# Patient Record
Sex: Female | Born: 1950
Health system: Southern US, Community
[De-identification: ages and names within clinical notes are randomized; demographics above are authoritative.]

## PROBLEM LIST (undated history)

## (undated) DIAGNOSIS — I10 Essential (primary) hypertension: Secondary | ICD-10-CM

## (undated) HISTORY — DX: Essential (primary) hypertension: I10

---

## 2010-12-19 ENCOUNTER — Other Ambulatory Visit (HOSPITAL_COMMUNITY)
Admission: RE | Admit: 2010-12-19 | Discharge: 2010-12-19 | Disposition: A | Discharge status: Home / Self Care | Payer: Managed Care, Other (non HMO) | Source: Ambulatory Visit | Attending: Internal Medicine | Admitting: Internal Medicine

## 2010-12-19 ENCOUNTER — Other Ambulatory Visit: Payer: Self-pay | Admitting: Internal Medicine

## 2010-12-19 ENCOUNTER — Encounter (INDEPENDENT_AMBULATORY_CARE_PROVIDER_SITE_OTHER): Payer: 59 | Admitting: Internal Medicine

## 2010-12-19 DIAGNOSIS — I10 Essential (primary) hypertension: Secondary | ICD-10-CM

## 2010-12-19 DIAGNOSIS — Z01419 Encounter for gynecological examination (general) (routine) without abnormal findings: Secondary | ICD-10-CM | POA: Insufficient documentation

## 2010-12-19 DIAGNOSIS — Z23 Encounter for immunization: Secondary | ICD-10-CM

## 2010-12-19 DIAGNOSIS — Z1231 Encounter for screening mammogram for malignant neoplasm of breast: Secondary | ICD-10-CM

## 2010-12-19 DIAGNOSIS — Z0000 Encounter for general adult medical examination without abnormal findings: Secondary | ICD-10-CM

## 2010-12-19 DIAGNOSIS — N951 Menopausal and female climacteric states: Secondary | ICD-10-CM

## 2011-01-04 ENCOUNTER — Ambulatory Visit
Admission: RE | Admit: 2011-01-04 | Discharge: 2011-01-04 | Disposition: A | Discharge status: Home / Self Care | Payer: 59 | Source: Ambulatory Visit | Attending: Internal Medicine | Admitting: Internal Medicine

## 2011-01-04 DIAGNOSIS — Z1231 Encounter for screening mammogram for malignant neoplasm of breast: Secondary | ICD-10-CM

## 2011-01-04 DIAGNOSIS — N951 Menopausal and female climacteric states: Secondary | ICD-10-CM

## 2011-02-02 ENCOUNTER — Telehealth: Payer: Self-pay | Admitting: Internal Medicine

## 2011-02-02 ENCOUNTER — Encounter: Payer: Self-pay | Admitting: Internal Medicine

## 2011-02-02 NOTE — Telephone Encounter (Signed)
Please mail her a copy

## 2011-02-02 NOTE — Telephone Encounter (Signed)
Spoke with patient regarding results.  Pt encouraged to continue with Vitamin D and Calcium.  Pt denied any questions or concerns at this time.

## 2011-10-11 ENCOUNTER — Other Ambulatory Visit: Payer: Self-pay

## 2011-10-11 MED ORDER — PINDOLOL 5 MG PO TABS: 5.0000 mg | ORAL_TABLET | Freq: Two times a day (BID) | ORAL | Status: DC

## 2011-12-22 ENCOUNTER — Encounter: Payer: Self-pay | Admitting: Internal Medicine

## 2011-12-22 ENCOUNTER — Ambulatory Visit (INDEPENDENT_AMBULATORY_CARE_PROVIDER_SITE_OTHER): Payer: BC Managed Care – PPO | Admitting: Internal Medicine

## 2011-12-22 VITALS — BP 130/82 | HR 76 | Temp 98.1°F | Ht 63.5 in | Wt 168.0 lb

## 2011-12-22 DIAGNOSIS — F419 Anxiety disorder, unspecified: Secondary | ICD-10-CM

## 2011-12-22 DIAGNOSIS — Z0000 Encounter for general adult medical examination without abnormal findings: Secondary | ICD-10-CM

## 2011-12-22 DIAGNOSIS — I10 Essential (primary) hypertension: Secondary | ICD-10-CM

## 2011-12-22 LAB — COMPREHENSIVE METABOLIC PANEL
ALT: 14 U/L (ref 0–35)
AST: 16 U/L (ref 0–37)
CO2: 29 mEq/L (ref 19–32)
Calcium: 10 mg/dL (ref 8.4–10.5)
Chloride: 102 mEq/L (ref 96–112)
Creat: 0.82 mg/dL (ref 0.50–1.10)
Sodium: 139 mEq/L (ref 135–145)
Total Protein: 6.8 g/dL (ref 6.0–8.3)

## 2011-12-22 LAB — CBC WITH DIFFERENTIAL/PLATELET
Basophils Absolute: 0 10*3/uL (ref 0.0–0.1)
Eosinophils Relative: 1 % (ref 0–5)
Lymphocytes Relative: 28 % (ref 12–46)
Lymphs Abs: 2.1 10*3/uL (ref 0.7–4.0)
Neutro Abs: 4.9 10*3/uL (ref 1.7–7.7)
Neutrophils Relative %: 65 % (ref 43–77)
Platelets: 331 10*3/uL (ref 150–400)
RBC: 4.53 MIL/uL (ref 3.87–5.11)
RDW: 12.9 % (ref 11.5–15.5)
WBC: 7.5 10*3/uL (ref 4.0–10.5)

## 2011-12-22 LAB — POCT URINALYSIS DIPSTICK
Glucose, UA: NEGATIVE
Leukocytes, UA: NEGATIVE
Nitrite, UA: NEGATIVE
Protein, UA: NEGATIVE
Spec Grav, UA: 1.01
Urobilinogen, UA: NEGATIVE

## 2011-12-22 LAB — LIPID PANEL
Total CHOL/HDL Ratio: 3.8 Ratio
VLDL: 15 mg/dL (ref 0–40)

## 2011-12-22 LAB — TSH: TSH: 1.601 u[IU]/mL (ref 0.350–4.500)

## 2011-12-23 LAB — VITAMIN D 25 HYDROXY (VIT D DEFICIENCY, FRACTURES): Vit D, 25-Hydroxy: 25 ng/mL — ABNORMAL LOW (ref 30–89)

## 2011-12-29 ENCOUNTER — Other Ambulatory Visit: Payer: Self-pay | Admitting: Internal Medicine

## 2011-12-29 DIAGNOSIS — Z1231 Encounter for screening mammogram for malignant neoplasm of breast: Secondary | ICD-10-CM

## 2012-01-20 ENCOUNTER — Encounter: Payer: Self-pay | Admitting: Internal Medicine

## 2012-01-20 DIAGNOSIS — I10 Essential (primary) hypertension: Secondary | ICD-10-CM | POA: Insufficient documentation

## 2012-01-20 DIAGNOSIS — F419 Anxiety disorder, unspecified: Secondary | ICD-10-CM | POA: Insufficient documentation

## 2012-01-20 NOTE — Progress Notes (Signed)
  Subjective:    Patient ID: Stephanie Hernandez, female    DOB: 07/19/1951, 61 y.o.   MRN: [id number]  HPI 61 year old white female with history of hypertension and anxiety in for physical examination. Her first visit at this office was 12/19/2010. Takes pindolol 5 mg 1-1/2 tablets daily and has taken this for many years for hypertension. Used to take Lexapro for anxiety. Takes Xanax 0.5 mg twice daily as needed.  History of fractured fifth metatarsal 2004. She is allergic to SULFA-it causes hives. Patient had colonoscopy in  [facility] in 2008 which she says was normal . Never had any operations. Had Pneumovax immunization in [facility] in 2002. Tdap given for 12/19/2010. Patient became menopausal around age 3.  Social history: Is single and has never been married. Completed one year of college. 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 Does not smoke or consume alcohol. 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 No children.  Family history: She stayed at home and took care of her 81 in the lid mother. Father died at age 18 suddenly. Mother died at age 45 of respiratory failure. No siblings. Maternal uncle with history of colon cancer in his 5s. Grandmother with history of uterine cancer.   Review of Systems  Constitutional: Negative.   HENT: Negative.   Eyes: Negative.   Respiratory: Negative.   Cardiovascular:       History of hypertension  Gastrointestinal: Negative.   Genitourinary: Negative.   Neurological: Negative.   Psychiatric/Behavioral:       History of anxiety       Objective:   Physical Exam  Vitals reviewed. Constitutional: She is oriented to person, place, and time. She appears well-developed and well-nourished. No distress.  HENT:  Head: Normocephalic and atraumatic.  Right Ear: External ear normal.  Left Ear: External ear normal.  Mouth/Throat: Oropharynx is clear and  moist. No oropharyngeal exudate.  Eyes: Conjunctivae and EOM are normal. Pupils are equal, round, and reactive to light. Right eye exhibits discharge. Left eye exhibits no discharge. No scleral icterus.  Neck: Normal range of motion. Neck supple. No JVD present. No thyromegaly present.  Cardiovascular: Normal rate, regular rhythm, normal heart sounds and intact distal pulses.   No murmur heard. Pulmonary/Chest: Effort normal and breath sounds normal. She has no wheezes.  Abdominal: Soft. Bowel sounds are normal. She exhibits no distension and no mass. There is no tenderness. There is no rebound and no guarding.  Genitourinary:       Bimanual exam is normal  Musculoskeletal: Normal range of motion. She exhibits no edema.  Lymphadenopathy:    She has no cervical adenopathy.  Neurological: She is alert and oriented to person, place, and time. She has normal reflexes. No cranial nerve deficit. Coordination normal.  Skin: Skin is warm and dry. No rash noted. She is not diaphoretic.  Psychiatric: She has a normal mood and affect. Her behavior is normal. Judgment and thought content normal.          Assessment & Plan:  Anxiety  Hypertension  Normal health maintenance exam  Plan: Return in one year or as needed. Have refill Xanax for 6 months. Have refilled pindolol for one year.

## 2012-01-20 NOTE — Patient Instructions (Signed)
Continue same medications and return in one year. 

## 2012-01-22 ENCOUNTER — Ambulatory Visit
Admission: RE | Admit: 2012-01-22 | Discharge: 2012-01-22 | Disposition: A | Discharge status: Home / Self Care | Payer: BC Managed Care – PPO | Source: Ambulatory Visit | Attending: Internal Medicine | Admitting: Internal Medicine

## 2012-01-22 DIAGNOSIS — Z1231 Encounter for screening mammogram for malignant neoplasm of breast: Secondary | ICD-10-CM

## 2012-07-01 ENCOUNTER — Ambulatory Visit (INDEPENDENT_AMBULATORY_CARE_PROVIDER_SITE_OTHER): Payer: BC Managed Care – PPO | Admitting: Internal Medicine

## 2012-07-01 ENCOUNTER — Encounter: Payer: Self-pay | Admitting: Internal Medicine

## 2012-07-01 VITALS — BP 132/80 | HR 76 | Temp 97.6°F | Wt 166.0 lb

## 2012-07-01 DIAGNOSIS — F419 Anxiety disorder, unspecified: Secondary | ICD-10-CM

## 2012-07-01 DIAGNOSIS — E559 Vitamin D deficiency, unspecified: Secondary | ICD-10-CM

## 2012-07-01 DIAGNOSIS — I10 Essential (primary) hypertension: Secondary | ICD-10-CM

## 2012-07-01 DIAGNOSIS — F411 Generalized anxiety disorder: Secondary | ICD-10-CM

## 2012-07-01 NOTE — Progress Notes (Signed)
  Subjective:    Patient ID: Stephanie Hernandez, female    DOB: [birth year], 61 y.o.   MRN: [id number]  HPI For 6 month follow up of HTN and Vitamin D deficiency. Some anxiety issues over job. Needs refill on Xanax. Taking 2000 Units Vitamin D OTC without problems. Level drawn today. BP under good control on current regimen.    Review of Systems     Objective:   Physical Exam Neck supple; Chest clear; cardiac exam regular rate and rhythm normal S1 and S2; extremities without edema; alert and oriented x3, affect appropriate. Skin warm and dry.        Assessment & Plan:  Anxiety  Hypertension  History of vitamin D deficiency  Plan: Return in 6 months for physical exam. Refill Xanax 0.5 mg (#60.) one by mouth twice a day when necessary anxiety with no refill. Continue vitamin D over-the-counter supplementation antihypertensive medication. Has had influenza immunization through employment.

## 2012-07-01 NOTE — Patient Instructions (Addendum)
Take Xanax sparingly for anxiety. Continue antihypertensive medication and vitamin D supplementation. Return in 6 months for physical exam.

## 2012-07-02 LAB — VITAMIN D 25 HYDROXY (VIT D DEFICIENCY, FRACTURES): Vit D, 25-Hydroxy: 64 ng/mL (ref 30–89)

## 2012-08-02 ENCOUNTER — Ambulatory Visit (INDEPENDENT_AMBULATORY_CARE_PROVIDER_SITE_OTHER): Payer: BC Managed Care – PPO | Admitting: Internal Medicine

## 2012-08-02 ENCOUNTER — Encounter: Payer: Self-pay | Admitting: Internal Medicine

## 2012-08-02 VITALS — BP 132/86 | HR 76 | Temp 98.5°F | Wt 169.0 lb

## 2012-08-02 DIAGNOSIS — J329 Chronic sinusitis, unspecified: Secondary | ICD-10-CM

## 2012-08-02 DIAGNOSIS — H6690 Otitis media, unspecified, unspecified ear: Secondary | ICD-10-CM

## 2012-08-02 DIAGNOSIS — J40 Bronchitis, not specified as acute or chronic: Secondary | ICD-10-CM

## 2012-08-02 MED ORDER — CEFTRIAXONE SODIUM 1 G IJ SOLR
1.0000 g | Freq: Once | INTRAMUSCULAR | Status: AC
  Administered 2012-08-02: 1 g via INTRAMUSCULAR

## 2012-08-25 ENCOUNTER — Encounter: Payer: Self-pay | Admitting: Internal Medicine

## 2012-08-25 NOTE — Patient Instructions (Addendum)
Take antibiotics as directed. Take cough medication as directed. Call if not better in 10 days to 2 weeks.

## 2012-08-25 NOTE — Progress Notes (Signed)
  Subjective:    Patient ID: Stephanie Hernandez, female    DOB: 1950-12-05, 62 y.o.   MRN: [id number]  HPI 62 year old white female with protracted URI symptoms. About a month ago she went to urgent care in [place] and was treated with a Zithromax Z-Pak and Tessalon Perles. However she really did not improve after completing that. She called that facility and asked for a refill which was given by phone. She completed that still did not improve. She has a history of hypertension and anxiety. Has continued to have cough and congestion. Trouble sleeping at night because of call. Cough is productive of white sputum. No fever or shaking chills.    Review of Systems     Objective:   Physical Exam skin is pale warm and dry. Nodes none. HEENT exam: TMs are slightly full bilaterally but not red. Pharynx is clear. Neck is supple without significant adenopathy. Chest clear to auscultation without rales or wheezing.        Assessment & Plan:  Bronchitis  Bilateral serous otitis media  Sinusitis  Plan: Levaquin 500 milligrams daily for 7 days.

## 2012-10-15 ENCOUNTER — Other Ambulatory Visit: Payer: Self-pay

## 2012-10-15 ENCOUNTER — Telehealth: Payer: Self-pay | Admitting: Internal Medicine

## 2012-10-15 MED ORDER — ALPRAZOLAM 0.5 MG PO TABS: 0.5000 mg | ORAL_TABLET | Freq: Every evening | ORAL | Status: DC | PRN

## 2012-10-15 NOTE — Telephone Encounter (Signed)
Please refill Xanax to new pharmacy for 6 months.

## 2012-10-16 DIAGNOSIS — F411 Generalized anxiety disorder: Secondary | ICD-10-CM

## 2012-11-05 ENCOUNTER — Ambulatory Visit (INDEPENDENT_AMBULATORY_CARE_PROVIDER_SITE_OTHER): Payer: BC Managed Care – PPO | Admitting: Internal Medicine

## 2012-11-05 ENCOUNTER — Encounter: Payer: Self-pay | Admitting: Internal Medicine

## 2012-11-05 VITALS — BP 140/90 | HR 80 | Temp 98.1°F | Wt 165.0 lb

## 2012-11-05 DIAGNOSIS — F411 Generalized anxiety disorder: Secondary | ICD-10-CM

## 2012-11-05 DIAGNOSIS — I10 Essential (primary) hypertension: Secondary | ICD-10-CM

## 2012-11-05 MED ORDER — ESCITALOPRAM OXALATE 10 MG PO TABS: 10.0000 mg | ORAL_TABLET | Freq: Every day | ORAL | Status: DC

## 2012-11-07 NOTE — Patient Instructions (Addendum)
Continue taking pindolol. Start Lexapro 5 mg daily increasing to 10 mg daily. May take Xanax up to twice daily as needed

## 2012-11-07 NOTE — Progress Notes (Signed)
  Subjective:    Patient ID: Stephanie Hernandez, female    DOB: 05/02/1951, 62 y.o.   MRN: [id number]  HPI  62 year old white female who has history of anxiety and hypertension. His been on pindolol for a number of years started by Dr. Cliffton Asters in [facility].  Recently she has  awakened at night and has a hard time going back to sleep. When she checks her blood pressure is elevated. She resides alone. She is single and never married. Has no pets. Worries a great deal. 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 It is stressful and they're many employees  interacting there on a daily basis. This is frustrating. She is a bit lonely. Just had her 62th birthday. Is warning if she needs a different antihypertensive medication. She is to take Lexapro for anxiety a long time ago. Recently she tried to take it again. She had some leftover that was a couple of years old and she thought it caused her heart to race. I think probably anxiety caused her heart to race.     Review of Systems     Objective:   Physical Exam skin is pale warm and dry. Nodes none. No thyromegaly. Chest clear to auscultation. Cardiac exam regular rate and rhythm normal S1 and S2. Extremities without edema.         Assessment & Plan:   Spent 25 minutes with the patient talking with her about her situation and tried to reassure her.  Impression:  Hypertension  Anxiety  Plan: Have not changed pindolol at the present time. Try Lexapro 5 mg daily working up to 10 mg daily. Prescribed Xanax 0.5 mg (#60) may take one half tablet by mouth every morning and 1 tablet at bedtime. Call if Lexapro not tolerated. Give it 4-6 weeks. Call if blood pressure persistently elevated.

## 2012-11-20 ENCOUNTER — Telehealth: Payer: Self-pay | Admitting: Internal Medicine

## 2012-11-21 NOTE — Telephone Encounter (Signed)
Increase to a whole tablet for one week and call me back. May not be taking ENOUGH medication.

## 2012-11-22 NOTE — Telephone Encounter (Signed)
Spoke with patient on 4/3 to advise to increase dosage per Dr. Lenord Fellers.  Patient verbalized understanding of instructions.

## 2012-12-09 ENCOUNTER — Telehealth: Payer: Self-pay | Admitting: Internal Medicine

## 2012-12-09 MED ORDER — SERTRALINE HCL 50 MG PO TABS: 50.0000 mg | ORAL_TABLET | Freq: Every day | ORAL | Status: DC

## 2012-12-09 NOTE — Telephone Encounter (Signed)
Switch to Zoloft 50 mg daily generic. Appt in 4 weeks.

## 2012-12-30 ENCOUNTER — Other Ambulatory Visit: Payer: Self-pay

## 2012-12-30 ENCOUNTER — Encounter: Payer: Self-pay | Admitting: Internal Medicine

## 2012-12-30 ENCOUNTER — Ambulatory Visit (INDEPENDENT_AMBULATORY_CARE_PROVIDER_SITE_OTHER): Payer: BC Managed Care – PPO | Admitting: Internal Medicine

## 2012-12-30 VITALS — BP 140/90 | HR 70 | Temp 97.0°F | Ht 62.25 in | Wt 159.0 lb

## 2012-12-30 DIAGNOSIS — Z1231 Encounter for screening mammogram for malignant neoplasm of breast: Secondary | ICD-10-CM

## 2012-12-30 DIAGNOSIS — F329 Major depressive disorder, single episode, unspecified: Secondary | ICD-10-CM

## 2012-12-30 DIAGNOSIS — Z0000 Encounter for general adult medical examination without abnormal findings: Secondary | ICD-10-CM

## 2012-12-30 DIAGNOSIS — F411 Generalized anxiety disorder: Secondary | ICD-10-CM

## 2012-12-30 DIAGNOSIS — I10 Essential (primary) hypertension: Secondary | ICD-10-CM

## 2012-12-30 LAB — POCT URINALYSIS DIPSTICK
Glucose, UA: NEGATIVE
Nitrite, UA: NEGATIVE
Urobilinogen, UA: NEGATIVE

## 2012-12-30 LAB — COMPREHENSIVE METABOLIC PANEL
ALT: 11 U/L (ref 0–35)
AST: 13 U/L (ref 0–37)
Alkaline Phosphatase: 64 U/L (ref 39–117)
CO2: 29 mEq/L (ref 19–32)
Chloride: 107 mEq/L (ref 96–112)
Creat: 0.78 mg/dL (ref 0.50–1.10)
Glucose, Bld: 97 mg/dL (ref 70–99)
Sodium: 141 mEq/L (ref 135–145)
Total Bilirubin: 0.5 mg/dL (ref 0.3–1.2)
Total Protein: 5.9 g/dL — ABNORMAL LOW (ref 6.0–8.3)

## 2012-12-30 LAB — CBC WITH DIFFERENTIAL/PLATELET
Eosinophils Absolute: 0.1 10*3/uL (ref 0.0–0.7)
Lymphocytes Relative: 20 % (ref 12–46)
Lymphs Abs: 1.3 10*3/uL (ref 0.7–4.0)
Neutrophils Relative %: 72 % (ref 43–77)
Platelets: 281 10*3/uL (ref 150–400)
RBC: 4.18 MIL/uL (ref 3.87–5.11)
WBC: 6.5 10*3/uL (ref 4.0–10.5)

## 2012-12-30 LAB — LIPID PANEL
Cholesterol: 165 mg/dL (ref 0–200)
LDL Cholesterol: 91 mg/dL (ref 0–99)
VLDL: 25 mg/dL (ref 0–40)

## 2012-12-30 LAB — TSH: TSH: 1.213 u[IU]/mL (ref 0.350–4.500)

## 2012-12-30 NOTE — Progress Notes (Signed)
  Subjective:    Patient ID: Stephanie Hernandez, female    DOB: 05/12/1951, 62 y.o.   MRN: [id number]  HPI 62 year old white female  for CPE and evaluation of medical problems. We recently restarted her Lexapro for anxiety. However, she said she could not tolerate it. She previously took Lexapro a few years ago. We switched her to Zoloft 50 mg daily on April 21. Zoloft  has been causing some diarrhea for past 3 weeks. BP elevated on Pindolol. Continues to have element of anxiety which seems to cause labile hypertension. Still has stress at work. Takes Xanax sparingly for anxiety.  Past medical history: History of fractured fifth metatarsal 2004. Colonoscopy in 2008 and [place] which she says was normal. No history of operations. Had Pneumovax immunization at [facility] in 2002. Became menopausal around age 51. Tetanus immunization done April 2012.  Social history: Single, never married. Completed one year of college. 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 Does not smoke or consume alcohol. 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 No children.  Family history: She stated home and took care of her elderly mother. Father died at age 52 suddenly. Mother died at age 7 of respiratory failure. No siblings. Maternal uncle with history of colon cancer in his 64s. Grandmother with history of uterine cancer.    Review of Systems  Constitutional: Positive for fatigue.  HENT: Negative.   Eyes: Negative.   Cardiovascular:       History of labile blood pressure. Occasional palpitations.  Gastrointestinal: Negative.   Neurological: Negative.   Hematological: Negative.   Psychiatric/Behavioral:       Anxiety. Mild depression.       Objective:   Physical Exam  Vitals reviewed. Constitutional: She is oriented to person, place, and time. She appears well-developed and well-nourished. No distress.  HENT:  Head:  Normocephalic and atraumatic.  Right Ear: External ear normal.  Left Ear: External ear normal.  Mouth/Throat: Oropharynx is clear and moist.  Eyes: EOM are normal. Pupils are equal, round, and reactive to light. Right eye exhibits no discharge. Left eye exhibits no discharge.  Neck: Neck supple. No JVD present. No thyromegaly present.  Cardiovascular: Normal rate, regular rhythm, normal heart sounds and intact distal pulses.   No murmur heard. Pulmonary/Chest: Effort normal and breath sounds normal. No respiratory distress. She has no wheezes. She has no rales. She exhibits no tenderness.  Breasts normal female  Abdominal: Soft. Bowel sounds are normal. She exhibits no distension and no mass. There is no tenderness. There is no rebound and no guarding.  Genitourinary:  Bimanual exam normal  Musculoskeletal: Normal range of motion. She exhibits no edema.  Lymphadenopathy:    She has no cervical adenopathy.  Neurological: She is alert and oriented to person, place, and time. She has normal reflexes. No cranial nerve deficit. Coordination normal.  Skin: Skin is warm and dry. No rash noted. She is not diaphoretic. No erythema. No pallor.  Psychiatric: She has a normal mood and affect. Her behavior is normal. Judgment and thought content normal.          Assessment & Plan:  Labile hypertension. Previously treated with pindolol. Blood pressure is elevated. Try Cozaar and discontinue pindolol  Continue Zoloft 50 mg daily for anxiety depression

## 2012-12-31 LAB — VITAMIN D 25 HYDROXY (VIT D DEFICIENCY, FRACTURES): Vit D, 25-Hydroxy: 38 ng/mL (ref 30–89)

## 2013-01-09 ENCOUNTER — Telehealth: Payer: Self-pay

## 2013-01-09 NOTE — Telephone Encounter (Signed)
Patient states that since switching from Pindolol to Cozaar, she is noticing her heart pounding more, and was worried because her pulse read 112 on auto BP cuff. BP was 111/71. Manual pulse at the time of this call was 80. Per Dr. Lenord Fellers, this sensation is because she stopped the Pindolol. May resume it, along with the Cozaar. She already has an appointment to return on 02/03/2013.patient verbalizes understanding.

## 2013-01-19 NOTE — Patient Instructions (Addendum)
Start Cozaar for hypertension. Stop pindolol. Continue Zoloft 50 mg daily. Return in 4 weeks.

## 2013-02-03 ENCOUNTER — Encounter: Payer: Self-pay | Admitting: Internal Medicine

## 2013-02-03 ENCOUNTER — Ambulatory Visit
Admission: RE | Admit: 2013-02-03 | Discharge: 2013-02-03 | Disposition: A | Discharge status: Home / Self Care | Payer: BC Managed Care – PPO | Source: Ambulatory Visit

## 2013-02-03 ENCOUNTER — Ambulatory Visit (INDEPENDENT_AMBULATORY_CARE_PROVIDER_SITE_OTHER): Payer: BC Managed Care – PPO | Admitting: Internal Medicine

## 2013-02-03 VITALS — BP 126/82 | HR 72 | Temp 98.1°F | Wt 160.0 lb

## 2013-02-03 DIAGNOSIS — Z1231 Encounter for screening mammogram for malignant neoplasm of breast: Secondary | ICD-10-CM

## 2013-02-03 DIAGNOSIS — F329 Major depressive disorder, single episode, unspecified: Secondary | ICD-10-CM

## 2013-02-03 DIAGNOSIS — R002 Palpitations: Secondary | ICD-10-CM

## 2013-02-03 DIAGNOSIS — I10 Essential (primary) hypertension: Secondary | ICD-10-CM

## 2013-02-03 DIAGNOSIS — Z79899 Other long term (current) drug therapy: Secondary | ICD-10-CM

## 2013-02-03 LAB — BASIC METABOLIC PANEL
Chloride: 102 mEq/L (ref 96–112)
Creat: 0.77 mg/dL (ref 0.50–1.10)
Potassium: 4.3 mEq/L (ref 3.5–5.3)

## 2013-02-03 MED ORDER — LOSARTAN POTASSIUM 50 MG PO TABS: 50.0000 mg | ORAL_TABLET | Freq: Every day | ORAL | Status: DC

## 2013-02-03 MED ORDER — PINDOLOL 5 MG PO TABS: 5.0000 mg | ORAL_TABLET | ORAL | Status: DC

## 2013-02-03 NOTE — Patient Instructions (Addendum)
Continue same meds and return January 2015 for 6 month recheck.

## 2013-02-04 MED ORDER — LOSARTAN POTASSIUM 25 MG PO TABS: 25.0000 mg | ORAL_TABLET | Freq: Every day | ORAL | Status: DC

## 2013-02-16 DIAGNOSIS — F329 Major depressive disorder, single episode, unspecified: Secondary | ICD-10-CM | POA: Insufficient documentation

## 2013-02-16 DIAGNOSIS — R002 Palpitations: Secondary | ICD-10-CM | POA: Insufficient documentation

## 2013-02-16 NOTE — Progress Notes (Signed)
  Subjective:    Patient ID: Manon Banbury, female    DOB: [birth year], 62 y.o.   MRN: [id number]  HPI 62 y.o. white female with history of hypertension, anxiety, palpitations, depression in today for followup. She called recently saying she was experiencing palpitations on Cozaar. She is now taking pindolol and Cozaar. Palpitations have improved. Also has switched from Lexapro to Zoloft for depression. Is tolerating that well but has some diarrhea with Zoloft which hopefully will resolve. Overall she's feeling better with regard anxiety depression.      Review of Systems     Objective:   Physical Exam skin warm and dry chest clear to auscultation. Cardiac exam regular rate and rhythm normal S1 and S2. Extremities without edema.        Assessment & Plan:  Hypertension-improved with Cozaar.  Palpitations-improved after restarting pindolol in addition to Cozaar  Anxiety depression-improved with Zoloft  Plan: Return in 6 months

## 2013-03-07 ENCOUNTER — Encounter: Payer: Self-pay | Admitting: Internal Medicine

## 2013-03-07 ENCOUNTER — Ambulatory Visit (INDEPENDENT_AMBULATORY_CARE_PROVIDER_SITE_OTHER): Payer: BC Managed Care – PPO | Admitting: Internal Medicine

## 2013-03-07 VITALS — BP 136/84 | HR 80 | Temp 98.3°F | Wt 165.0 lb

## 2013-03-07 DIAGNOSIS — L299 Pruritus, unspecified: Secondary | ICD-10-CM

## 2013-03-07 DIAGNOSIS — B373 Candidiasis of vulva and vagina: Secondary | ICD-10-CM

## 2013-03-07 DIAGNOSIS — B3731 Acute candidiasis of vulva and vagina: Secondary | ICD-10-CM

## 2013-03-07 DIAGNOSIS — IMO0001 Reserved for inherently not codable concepts without codable children: Secondary | ICD-10-CM

## 2013-03-07 DIAGNOSIS — R350 Frequency of micturition: Secondary | ICD-10-CM

## 2013-03-07 MED ORDER — FLUCONAZOLE 150 MG PO TABS: 150.0000 mg | ORAL_TABLET | Freq: Once | ORAL | Status: DC

## 2013-03-07 NOTE — Patient Instructions (Addendum)
Take Diflucan once and repeat in 3 days if still symptomatic

## 2013-05-25 NOTE — Progress Notes (Signed)
  Subjective:    Patient ID: Stephanie Hernandez, female    DOB: 1951/05/04, 62 y.o.   MRN: [id number]  HPI Patient in today complaining of vaginal itching. She is on pindolol and Cozaar for hypertension. She is on Zoloft for anxiety depression. Still having issues with fatigue. Palpitations improved with restarting pindolol. Patient thought she had a urinary tract infection and and took some over-the-counter medication for bladder spasms.   Review of Systems     Objective:   Physical Exam Vaginal exam shows white vaginal discharge. Wet prep consistent with yeast       Assessment & Plan:  Candida vaginitis  Hypertension  Anxiety depression  Plan: Diflucan 150 mg tablet x1. Repeat if necessary.

## 2013-06-26 ENCOUNTER — Other Ambulatory Visit: Payer: Self-pay

## 2013-08-08 ENCOUNTER — Ambulatory Visit: Payer: BC Managed Care – PPO | Admitting: Internal Medicine

## 2013-09-12 ENCOUNTER — Ambulatory Visit: Payer: BC Managed Care – PPO | Admitting: Internal Medicine

## 2013-10-06 ENCOUNTER — Encounter: Payer: Self-pay | Admitting: Internal Medicine

## 2013-10-06 ENCOUNTER — Ambulatory Visit (INDEPENDENT_AMBULATORY_CARE_PROVIDER_SITE_OTHER): Payer: BC Managed Care – PPO | Admitting: Internal Medicine

## 2013-10-06 VITALS — BP 122/88 | HR 68 | Temp 98.2°F | Wt 178.5 lb

## 2013-10-06 DIAGNOSIS — I10 Essential (primary) hypertension: Secondary | ICD-10-CM

## 2013-10-06 DIAGNOSIS — F411 Generalized anxiety disorder: Secondary | ICD-10-CM

## 2013-10-06 LAB — BASIC METABOLIC PANEL
BUN: 13 mg/dL (ref 6–23)
CHLORIDE: 104 meq/L (ref 96–112)
CO2: 27 meq/L (ref 19–32)
Calcium: 9.6 mg/dL (ref 8.4–10.5)
Creat: 0.81 mg/dL (ref 0.50–1.10)
Glucose, Bld: 87 mg/dL (ref 70–99)
POTASSIUM: 4.3 meq/L (ref 3.5–5.3)
Sodium: 139 mEq/L (ref 135–145)

## 2013-10-06 MED ORDER — ALPRAZOLAM 0.5 MG PO TABS
0.5000 mg | ORAL_TABLET | Freq: Two times a day (BID) | ORAL | Status: DC | PRN
Start: 2013-10-06 — End: 2014-04-03

## 2013-10-06 NOTE — Progress Notes (Signed)
   Subjective:    Patient ID: Posey BoyerJudy Wannamaker, female    DOB: [birth year], 63 y.o.   MRN: [id number]  HPI  63 year old Female for 6 month recheck. Doing well with anxiety depression although work remained stressful. Blood pressure is excellent on current regimen. No new complaints. Basic metabolic panel drawn today. Stopped taking Zoloft because she thought was calling her to gain weight.    Review of Systems     Objective:   Physical Exam neck is supple without JVD thyromegaly or carotid bruits. Chest clear. Cardiac exam regular rate and rhythm. Extremities without edema.        Assessment & Plan:  Anxiety depression-treated with Xanax only now. Patient discontinued Zoloft because she thought it was causing weight gain  Hypertension-stable  Plan: Basic metabolic panel drawn and is within normal limits. Return in 6 months for physical exam and continue same medication.

## 2013-10-19 ENCOUNTER — Encounter: Payer: Self-pay | Admitting: Internal Medicine

## 2013-10-19 NOTE — Patient Instructions (Signed)
Zoloft has been discontinued by patient. Continue same medication and return in 6 months.

## 2014-02-04 ENCOUNTER — Other Ambulatory Visit: Payer: Self-pay | Admitting: Internal Medicine

## 2014-02-09 ENCOUNTER — Encounter: Payer: Self-pay | Admitting: Internal Medicine

## 2014-02-09 ENCOUNTER — Ambulatory Visit (INDEPENDENT_AMBULATORY_CARE_PROVIDER_SITE_OTHER): Payer: BC Managed Care – PPO | Admitting: Internal Medicine

## 2014-02-09 VITALS — BP 116/76 | HR 80 | Temp 98.8°F | Wt 172.0 lb

## 2014-02-09 DIAGNOSIS — F3289 Other specified depressive episodes: Secondary | ICD-10-CM

## 2014-02-09 DIAGNOSIS — F329 Major depressive disorder, single episode, unspecified: Secondary | ICD-10-CM

## 2014-02-09 DIAGNOSIS — J069 Acute upper respiratory infection, unspecified: Secondary | ICD-10-CM

## 2014-02-09 DIAGNOSIS — F32A Depression, unspecified: Secondary | ICD-10-CM

## 2014-02-09 DIAGNOSIS — I10 Essential (primary) hypertension: Secondary | ICD-10-CM

## 2014-02-09 MED ORDER — SERTRALINE HCL 50 MG PO TABS: 50.0000 mg | ORAL_TABLET | Freq: Every day | ORAL | Status: DC

## 2014-02-09 MED ORDER — LOSARTAN POTASSIUM 25 MG PO TABS: 25.0000 mg | ORAL_TABLET | Freq: Every day | ORAL | Status: DC

## 2014-02-09 MED ORDER — LEVOFLOXACIN 500 MG PO TABS: 500.0000 mg | ORAL_TABLET | Freq: Every day | ORAL | Status: DC

## 2014-02-09 NOTE — Progress Notes (Signed)
   Subjective:    Patient ID: Stephanie Hernandez, female    DOB: 12/19/1950, 63 y.o.   MRN: [id number]  HPI  Onset slight cough  June 19th and subsequently developed hoarseness and upper respiratory congestion. Had fever. Hasn't felt well since then. Did not go to work today. History of hypertension and palpitations. Feels that she may be depressed and would like to go back on Zoloft.    Review of Systems     Objective:   Physical Exam  TMs and pharynx are clear. Neck is supple. She sounds a bit hoarse. Chest clear to auscultation without rales or wheezing.        Assessment & Plan:  Acute URI  Hypertension-refill losartan  Depression-refill Zoloft  History of anxiety  Plan: Levaquin 500 milligrams daily for 7 days. Refill Zoloft so she may restart that. Refill losartan.

## 2014-02-09 NOTE — Patient Instructions (Addendum)
Levaquin 500 mg daily x 7 days. Refill Losartan. Restart Zoloft.

## 2014-02-14 ENCOUNTER — Encounter: Payer: Self-pay | Admitting: Internal Medicine

## 2014-04-03 ENCOUNTER — Ambulatory Visit (INDEPENDENT_AMBULATORY_CARE_PROVIDER_SITE_OTHER): Payer: BC Managed Care – PPO | Admitting: Internal Medicine

## 2014-04-03 ENCOUNTER — Other Ambulatory Visit (HOSPITAL_COMMUNITY)
Admission: RE | Admit: 2014-04-03 | Discharge: 2014-04-03 | Disposition: A | Discharge status: Home / Self Care | Payer: BC Managed Care – PPO | Source: Ambulatory Visit | Attending: Internal Medicine | Admitting: Internal Medicine

## 2014-04-03 ENCOUNTER — Encounter: Payer: Self-pay | Admitting: Internal Medicine

## 2014-04-03 VITALS — BP 126/82 | HR 76 | Ht 62.0 in | Wt 164.0 lb

## 2014-04-03 DIAGNOSIS — G4700 Insomnia, unspecified: Secondary | ICD-10-CM

## 2014-04-03 DIAGNOSIS — Z1329 Encounter for screening for other suspected endocrine disorder: Secondary | ICD-10-CM

## 2014-04-03 DIAGNOSIS — F341 Dysthymic disorder: Secondary | ICD-10-CM

## 2014-04-03 DIAGNOSIS — F419 Anxiety disorder, unspecified: Secondary | ICD-10-CM

## 2014-04-03 DIAGNOSIS — R002 Palpitations: Secondary | ICD-10-CM

## 2014-04-03 DIAGNOSIS — Z13228 Encounter for screening for other metabolic disorders: Secondary | ICD-10-CM

## 2014-04-03 DIAGNOSIS — Z01419 Encounter for gynecological examination (general) (routine) without abnormal findings: Secondary | ICD-10-CM | POA: Diagnosis not present

## 2014-04-03 DIAGNOSIS — F32A Depression, unspecified: Secondary | ICD-10-CM

## 2014-04-03 DIAGNOSIS — Z13220 Encounter for screening for lipoid disorders: Secondary | ICD-10-CM

## 2014-04-03 DIAGNOSIS — I10 Essential (primary) hypertension: Secondary | ICD-10-CM

## 2014-04-03 DIAGNOSIS — F329 Major depressive disorder, single episode, unspecified: Secondary | ICD-10-CM

## 2014-04-03 DIAGNOSIS — Z130 Encounter for screening for diseases of the blood and blood-forming organs and certain disorders involving the immune mechanism: Secondary | ICD-10-CM

## 2014-04-03 DIAGNOSIS — Z0000 Encounter for general adult medical examination without abnormal findings: Secondary | ICD-10-CM

## 2014-04-03 LAB — COMPREHENSIVE METABOLIC PANEL
ALT: 18 U/L (ref 0–35)
AST: 16 U/L (ref 0–37)
Albumin: 4.6 g/dL (ref 3.5–5.2)
Alkaline Phosphatase: 72 U/L (ref 39–117)
BUN: 9 mg/dL (ref 6–23)
CALCIUM: 9.9 mg/dL (ref 8.4–10.5)
CHLORIDE: 100 meq/L (ref 96–112)
CO2: 26 meq/L (ref 19–32)
CREATININE: 0.74 mg/dL (ref 0.50–1.10)
GLUCOSE: 98 mg/dL (ref 70–99)
Potassium: 4.6 mEq/L (ref 3.5–5.3)
Sodium: 140 mEq/L (ref 135–145)
Total Bilirubin: 0.8 mg/dL (ref 0.2–1.2)
Total Protein: 6.8 g/dL (ref 6.0–8.3)

## 2014-04-03 LAB — POCT URINALYSIS DIPSTICK
Bilirubin, UA: NEGATIVE
GLUCOSE UA: NEGATIVE
KETONES UA: NEGATIVE
Leukocytes, UA: NEGATIVE
Nitrite, UA: NEGATIVE
PROTEIN UA: NEGATIVE
RBC UA: NEGATIVE
Urobilinogen, UA: NEGATIVE
pH, UA: 7

## 2014-04-03 LAB — LIPID PANEL
Cholesterol: 172 mg/dL (ref 0–200)
HDL: 49 mg/dL (ref >39)
LDL CALC: 94 mg/dL (ref 0–99)
TRIGLYCERIDES: 146 mg/dL (ref <150)
Total CHOL/HDL Ratio: 3.5 Ratio
VLDL: 29 mg/dL (ref 0–40)

## 2014-04-03 LAB — CBC WITH DIFFERENTIAL/PLATELET
Basophils Absolute: 0 10*3/uL (ref 0.0–0.1)
Basophils Relative: 0 % (ref 0–1)
EOS PCT: 1 % (ref 0–5)
Eosinophils Absolute: 0.1 10*3/uL (ref 0.0–0.7)
HEMATOCRIT: 40.3 % (ref 36.0–46.0)
Hemoglobin: 13.5 g/dL (ref 12.0–15.0)
LYMPHS ABS: 1.6 10*3/uL (ref 0.7–4.0)
LYMPHS PCT: 19 % (ref 12–46)
MCH: 30.5 pg (ref 26.0–34.0)
MCHC: 33.5 g/dL (ref 30.0–36.0)
MCV: 91 fL (ref 78.0–100.0)
MONO ABS: 0.6 10*3/uL (ref 0.1–1.0)
Monocytes Relative: 7 % (ref 3–12)
Neutro Abs: 6.1 10*3/uL (ref 1.7–7.7)
Neutrophils Relative %: 73 % (ref 43–77)
Platelets: 359 10*3/uL (ref 150–400)
RBC: 4.43 MIL/uL (ref 3.87–5.11)
RDW: 13.7 % (ref 11.5–15.5)
WBC: 8.4 10*3/uL (ref 4.0–10.5)

## 2014-04-03 MED ORDER — SERTRALINE HCL 100 MG PO TABS: 100.0000 mg | ORAL_TABLET | Freq: Every day | ORAL | Status: DC

## 2014-04-03 MED ORDER — ALPRAZOLAM 1 MG PO TABS: ORAL_TABLET | ORAL | Status: DC

## 2014-04-03 NOTE — Patient Instructions (Addendum)
Call with progress report in 4 weeks. Increase Xanax  1 mg at bedtime. Increase Zoloft to 100 mg daily. Takes 0.5 mg of Xanax every morning. Consider counseling.

## 2014-04-04 LAB — TSH: TSH: 1.629 u[IU]/mL (ref 0.350–4.500)

## 2014-04-04 LAB — VITAMIN D 25 HYDROXY (VIT D DEFICIENCY, FRACTURES): VIT D 25 HYDROXY: 41 ng/mL (ref 30–89)

## 2014-04-07 LAB — CYTOLOGY - PAP

## 2014-04-28 ENCOUNTER — Telehealth: Payer: Self-pay | Admitting: Internal Medicine

## 2014-04-28 NOTE — Telephone Encounter (Signed)
Not tolerating Zoloft. Wants something other than Xanax to sleep. Try Pristiq 50 mg daily and Ativan one mg hs. Please call her and tell her I tried home and cell phone. If these do not work, then I will probably get her a med consult.

## 2014-04-28 NOTE — Telephone Encounter (Signed)
RE:  Zoloft.  It's making her feel confused; not like herself.  She's been on it a month.  She stopped it x 2 weeks.  No dizziness.  The confusion is better.  She needs something for anxiety.  What other options does she have?    She thinks she wants something other than Xanax to take at night for sleep.  Patient states you told her to call and you would call her after hours; so I didn't make her an appointment until after sending this for your review.  Please advise.

## 2014-05-01 MED ORDER — LORAZEPAM 1 MG PO TABS: 1.0000 mg | ORAL_TABLET | Freq: Every day | ORAL | Status: DC

## 2014-05-01 MED ORDER — DESVENLAFAXINE SUCCINATE ER 50 MG PO TB24: 50.0000 mg | ORAL_TABLET | Freq: Every day | ORAL | Status: DC

## 2014-05-01 NOTE — Telephone Encounter (Signed)
Left message for patient to call office regarding medication change.  Pristiq and ativan sent to pharmacy.

## 2014-05-01 NOTE — Addendum Note (Signed)
Addended by: Judd Gaudier on: 05/01/2014 03:10 PM   Modules accepted: Orders

## 2014-05-04 ENCOUNTER — Ambulatory Visit (INDEPENDENT_AMBULATORY_CARE_PROVIDER_SITE_OTHER): Payer: BC Managed Care – PPO | Admitting: Internal Medicine

## 2014-05-04 ENCOUNTER — Encounter: Payer: Self-pay | Admitting: Internal Medicine

## 2014-05-04 VITALS — BP 148/102 | HR 80 | Ht 62.0 in | Wt 163.0 lb

## 2014-05-04 DIAGNOSIS — F411 Generalized anxiety disorder: Secondary | ICD-10-CM

## 2014-05-04 DIAGNOSIS — F3289 Other specified depressive episodes: Secondary | ICD-10-CM

## 2014-05-04 DIAGNOSIS — F32A Depression, unspecified: Secondary | ICD-10-CM

## 2014-05-04 DIAGNOSIS — F329 Major depressive disorder, single episode, unspecified: Secondary | ICD-10-CM

## 2014-05-04 NOTE — Progress Notes (Signed)
   Subjective:    Patient ID: Stephanie Hernandez, female    DOB: 1950-10-11, 63 y.o.   MRN: [id number]  HPI  Accompanied today by a friend and nurse practitioner, Lanier Prude. Patient feels that Zoloft and Xanax or not working for her. However one night she took exam next 1 mg and slept all night long and did not wake which frightened her. She has a history of anxiety and depression related to situational stress at work. She is overwhelmed by her job with multiple duties. Feels like she cannot find another job at this point in time at her age. Has been encouraged look elsewhere by her friend and myself.    Review of Systems     Objective:   Physical Exam  Spent 25 minutes speaking with patient about these issues. She agrees to try new SSRI. Have provided samples of Viibryd. Suggested she take Xanax 0.5 mg in the morning, 0.5 mg in the early afternoon, and 1 mg at bedtime      Assessment & Plan:  Anxiety  Depression  Plan: Start low-dose fibroid and take Xanax as above. Let he know through Patient Advice portal how you're doing in 4-6 weeks

## 2014-05-17 ENCOUNTER — Encounter: Payer: Self-pay | Admitting: Internal Medicine

## 2014-05-17 NOTE — Patient Instructions (Signed)
Start low-dose Viibryd and take Xanax as directed. Let me know through patient advice portal how you're doing in 4-6 weeks

## 2014-05-17 NOTE — Progress Notes (Signed)
   Subjective:    Patient ID: Stephanie Hernandez, female    DOB: 1951/03/04, 63 y.o.   MRN: [id number]  HPI 63 year old White Female in today for health maintenance and evaluation of medical issues. Considerable issues with anxiety and insomnia. Not happy with job. Workload is excessive. He is depressed as well as anxious. Doesn't feel like she can find another position at this point in time at her age.  She has been on Lexapro in the past. Has been on Zoloft 50 mg recently. Takes Xanax for anxiety. History of hypertension.  Past medical history: History of fractured fifth metatarsal 2004. Colonoscopy in 2008 done in [facility] which she says was normal. No history of operations. Patient had Pneumovax immunization at [facility] in 2002. Became menopausal around age 71. Tetanus immunization done April 2012.  Social history: Single, never married. Completed one year of college. 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 No children.  Family history: She stayed at home and took care of her elderly mother. Father died at age 65 suddenly. Mother died at age 22 of respiratory failure. No siblings. Maternal uncle with history of colon cancer in his 47s. Grandmother with history of uterine cancer.    Review of Systems  Constitutional: Positive for fatigue.  Eyes: Negative.   Respiratory: Negative.   Cardiovascular: Negative for chest pain.  Gastrointestinal: Negative.   Genitourinary: Negative.   Neurological: Negative.   Psychiatric/Behavioral:       Anxiety depression and insomnia       Objective:   Physical Exam  Vitals reviewed. Constitutional: She is oriented to person, place, and time. She appears well-developed and well-nourished. No distress.  HENT:  Head: Normocephalic and atraumatic.  Right Ear: External ear normal.  Left Ear:  External ear normal.  Mouth/Throat: Oropharynx is clear and moist. No oropharyngeal exudate.  Eyes: Conjunctivae and EOM are normal. Pupils are equal, round, and reactive to light. Right eye exhibits no discharge. Left eye exhibits no discharge.  Neck: Neck supple. No JVD present. No thyromegaly present.  Cardiovascular: Normal rate, regular rhythm, normal heart sounds and intact distal pulses.   No murmur heard. Pulmonary/Chest: Effort normal and breath sounds normal. No respiratory distress. She has no rales. She exhibits no tenderness.  Breasts normal female  Abdominal: Soft. Bowel sounds are normal. She exhibits no distension and no mass. There is no tenderness. There is no rebound.  Genitourinary:  Bimanual exam normal. Pap taken.  Musculoskeletal: Normal range of motion. She exhibits no edema.  Lymphadenopathy:    She has no cervical adenopathy.  Neurological: She is alert and oriented to person, place, and time. She has normal reflexes. No cranial nerve deficit.  Skin: Skin is warm and dry. No rash noted. She is not diaphoretic.  Psychiatric: Her behavior is normal. Judgment and thought content normal.  Mood is anxious and dysphoric          Assessment & Plan:  Hypertension-stable on current regimen  History of palpitations treated with Pindolol  Anxiety and depression-see below  Insomnia-see below  Situational stress-may need to find new employment  Plan: Increase Zoloft 100 mg daily. Takes Xanax 0.5 mg every morning and 1 mg at bedtime.   Consider counseling for situational stress. Call with progress report in 4 weeks.

## 2014-06-02 ENCOUNTER — Other Ambulatory Visit: Payer: Self-pay

## 2014-06-02 DIAGNOSIS — Z1239 Encounter for other screening for malignant neoplasm of breast: Secondary | ICD-10-CM

## 2014-07-01 ENCOUNTER — Ambulatory Visit
Admission: RE | Admit: 2014-07-01 | Discharge: 2014-07-01 | Disposition: A | Discharge status: Home / Self Care | Payer: BC Managed Care – PPO | Source: Ambulatory Visit

## 2014-07-01 DIAGNOSIS — Z1239 Encounter for other screening for malignant neoplasm of breast: Secondary | ICD-10-CM

## 2014-07-03 ENCOUNTER — Other Ambulatory Visit: Payer: Self-pay | Admitting: Internal Medicine

## 2014-07-03 DIAGNOSIS — R928 Other abnormal and inconclusive findings on diagnostic imaging of breast: Secondary | ICD-10-CM

## 2014-07-10 ENCOUNTER — Ambulatory Visit
Admission: RE | Admit: 2014-07-10 | Discharge: 2014-07-10 | Disposition: A | Discharge status: Home / Self Care | Payer: BC Managed Care – PPO | Source: Ambulatory Visit | Attending: Internal Medicine | Admitting: Internal Medicine

## 2014-07-10 ENCOUNTER — Other Ambulatory Visit: Payer: Self-pay | Admitting: Internal Medicine

## 2014-07-10 DIAGNOSIS — R928 Other abnormal and inconclusive findings on diagnostic imaging of breast: Secondary | ICD-10-CM

## 2014-07-10 DIAGNOSIS — N6320 Unspecified lump in the left breast, unspecified quadrant: Secondary | ICD-10-CM

## 2014-07-22 ENCOUNTER — Telehealth: Payer: Self-pay | Admitting: Internal Medicine

## 2014-07-22 ENCOUNTER — Ambulatory Visit
Admission: RE | Admit: 2014-07-22 | Discharge: 2014-07-22 | Disposition: A | Discharge status: Home / Self Care | Payer: BC Managed Care – PPO | Source: Ambulatory Visit | Attending: Internal Medicine | Admitting: Internal Medicine

## 2014-07-22 DIAGNOSIS — N6320 Unspecified lump in the left breast, unspecified quadrant: Secondary | ICD-10-CM

## 2014-07-22 NOTE — Telephone Encounter (Signed)
Patient had breast biopsy today. Results are pending. Called  to tell her I was aware of breast biopsy and offer  support. Says she feels good post procedure.

## 2014-08-03 ENCOUNTER — Other Ambulatory Visit: Payer: Self-pay

## 2014-08-03 MED ORDER — PINDOLOL 5 MG PO TABS: 5.0000 mg | ORAL_TABLET | Freq: Every day | ORAL | Status: DC

## 2014-12-21 ENCOUNTER — Ambulatory Visit (INDEPENDENT_AMBULATORY_CARE_PROVIDER_SITE_OTHER): Payer: BLUE CROSS/BLUE SHIELD | Admitting: Internal Medicine

## 2014-12-21 ENCOUNTER — Encounter: Payer: Self-pay | Admitting: Internal Medicine

## 2014-12-21 VITALS — BP 118/76 | HR 71 | Temp 97.6°F | Wt 182.0 lb

## 2014-12-21 DIAGNOSIS — I10 Essential (primary) hypertension: Secondary | ICD-10-CM | POA: Diagnosis not present

## 2014-12-21 DIAGNOSIS — R609 Edema, unspecified: Secondary | ICD-10-CM

## 2014-12-21 MED ORDER — HYDROCHLOROTHIAZIDE 25 MG PO TABS: 25.0000 mg | ORAL_TABLET | Freq: Every day | ORAL | Status: DC | PRN

## 2014-12-21 NOTE — Patient Instructions (Signed)
Try to walk daily which will help with edema. Watch salt intake. Take HCTZ as needed for leg swelling.

## 2014-12-21 NOTE — Progress Notes (Signed)
   Subjective:    Patient ID: Stephanie BoyerJudy Andrepont, female    DOB: 09-21-1950, 64 y.o.   MRN: [id number]  HPI  Patient in today complaining of issues with leg swelling. Seems to have mild dependent edema worse with sitting. Has been eating out more and think she's been consuming more salt. Mother had issues with dependent edema is well. She has no diuretic to take. Spends most of her day sitting. Needs to be walking on a daily basis. Says it over the weekend edema improved for some reason.    Review of Systems     Objective:   Physical Exam  Skin warm and dry. Nodes none. Neck supple chest clear to auscultation cardiac exam regular rate and rhythm extremities without pitting edema just very mild puffiness noted in ankles      Assessment & Plan:  Dependent edema  Essential hypertension  History of anxiety depression  Plan: HCTZ 25 mg take on a when necessary basis. Has appointment in August for physical examination.

## 2014-12-31 ENCOUNTER — Other Ambulatory Visit: Payer: Self-pay | Admitting: Internal Medicine

## 2014-12-31 DIAGNOSIS — N6012 Diffuse cystic mastopathy of left breast: Secondary | ICD-10-CM

## 2015-01-25 ENCOUNTER — Ambulatory Visit
Admission: RE | Admit: 2015-01-25 | Discharge: 2015-01-25 | Disposition: A | Discharge status: Home / Self Care | Payer: BLUE CROSS/BLUE SHIELD | Source: Ambulatory Visit | Attending: Internal Medicine | Admitting: Internal Medicine

## 2015-01-25 DIAGNOSIS — N6012 Diffuse cystic mastopathy of left breast: Secondary | ICD-10-CM

## 2015-02-01 ENCOUNTER — Other Ambulatory Visit: Payer: Self-pay | Admitting: *Deleted

## 2015-02-01 MED ORDER — LOSARTAN POTASSIUM 25 MG PO TABS: 25.0000 mg | ORAL_TABLET | Freq: Every day | ORAL | Status: DC

## 2015-02-01 NOTE — Telephone Encounter (Signed)
Refilled losartan 

## 2015-05-06 ENCOUNTER — Other Ambulatory Visit: Payer: Self-pay | Admitting: Internal Medicine

## 2015-05-06 DIAGNOSIS — Z13220 Encounter for screening for lipoid disorders: Secondary | ICD-10-CM

## 2015-05-06 DIAGNOSIS — Z1329 Encounter for screening for other suspected endocrine disorder: Secondary | ICD-10-CM

## 2015-05-06 DIAGNOSIS — Z0000 Encounter for general adult medical examination without abnormal findings: Secondary | ICD-10-CM

## 2015-05-06 DIAGNOSIS — Z130 Encounter for screening for diseases of the blood and blood-forming organs and certain disorders involving the immune mechanism: Secondary | ICD-10-CM

## 2015-05-06 DIAGNOSIS — Z1321 Encounter for screening for nutritional disorder: Secondary | ICD-10-CM

## 2015-05-07 ENCOUNTER — Ambulatory Visit (INDEPENDENT_AMBULATORY_CARE_PROVIDER_SITE_OTHER): Payer: BLUE CROSS/BLUE SHIELD | Admitting: Internal Medicine

## 2015-05-07 ENCOUNTER — Other Ambulatory Visit (INDEPENDENT_AMBULATORY_CARE_PROVIDER_SITE_OTHER): Payer: BLUE CROSS/BLUE SHIELD | Admitting: Internal Medicine

## 2015-05-07 ENCOUNTER — Encounter: Payer: Self-pay | Admitting: Internal Medicine

## 2015-05-07 VITALS — BP 130/80 | HR 72 | Temp 98.0°F | Wt 176.0 lb

## 2015-05-07 DIAGNOSIS — Z0000 Encounter for general adult medical examination without abnormal findings: Secondary | ICD-10-CM

## 2015-05-07 DIAGNOSIS — Z1329 Encounter for screening for other suspected endocrine disorder: Secondary | ICD-10-CM

## 2015-05-07 DIAGNOSIS — N6012 Diffuse cystic mastopathy of left breast: Secondary | ICD-10-CM | POA: Diagnosis not present

## 2015-05-07 DIAGNOSIS — I10 Essential (primary) hypertension: Secondary | ICD-10-CM | POA: Diagnosis not present

## 2015-05-07 DIAGNOSIS — F411 Generalized anxiety disorder: Secondary | ICD-10-CM

## 2015-05-07 DIAGNOSIS — Z13220 Encounter for screening for lipoid disorders: Secondary | ICD-10-CM

## 2015-05-07 DIAGNOSIS — G4700 Insomnia, unspecified: Secondary | ICD-10-CM | POA: Diagnosis not present

## 2015-05-07 DIAGNOSIS — Z1321 Encounter for screening for nutritional disorder: Secondary | ICD-10-CM

## 2015-05-07 DIAGNOSIS — E559 Vitamin D deficiency, unspecified: Secondary | ICD-10-CM | POA: Diagnosis not present

## 2015-05-07 DIAGNOSIS — Z130 Encounter for screening for diseases of the blood and blood-forming organs and certain disorders involving the immune mechanism: Secondary | ICD-10-CM

## 2015-05-07 LAB — CBC WITH DIFFERENTIAL/PLATELET
BASOS ABS: 0 10*3/uL (ref 0.0–0.1)
Basophils Relative: 0 % (ref 0–1)
EOS ABS: 0.1 10*3/uL (ref 0.0–0.7)
Eosinophils Relative: 1 % (ref 0–5)
HCT: 41 % (ref 36.0–46.0)
HEMOGLOBIN: 14.2 g/dL (ref 12.0–15.0)
LYMPHS ABS: 1.4 10*3/uL (ref 0.7–4.0)
Lymphocytes Relative: 17 % (ref 12–46)
MCH: 31.4 pg (ref 26.0–34.0)
MCHC: 34.6 g/dL (ref 30.0–36.0)
MCV: 90.7 fL (ref 78.0–100.0)
MPV: 10.9 fL (ref 8.6–12.4)
Monocytes Absolute: 0.5 10*3/uL (ref 0.1–1.0)
Monocytes Relative: 6 % (ref 3–12)
NEUTROS ABS: 6.4 10*3/uL (ref 1.7–7.7)
NEUTROS PCT: 76 % (ref 43–77)
PLATELETS: 320 10*3/uL (ref 150–400)
RBC: 4.52 MIL/uL (ref 3.87–5.11)
RDW: 13.6 % (ref 11.5–15.5)
WBC: 8.4 10*3/uL (ref 4.0–10.5)

## 2015-05-07 LAB — COMPREHENSIVE METABOLIC PANEL
ALK PHOS: 88 U/L (ref 33–130)
ALT: 17 U/L (ref 6–29)
AST: 17 U/L (ref 10–35)
Albumin: 4.5 g/dL (ref 3.6–5.1)
BUN: 11 mg/dL (ref 7–25)
CO2: 27 mmol/L (ref 20–31)
CREATININE: 0.82 mg/dL (ref 0.50–0.99)
Calcium: 9.9 mg/dL (ref 8.6–10.4)
Chloride: 102 mmol/L (ref 98–110)
Glucose, Bld: 96 mg/dL (ref 65–99)
POTASSIUM: 4.7 mmol/L (ref 3.5–5.3)
SODIUM: 139 mmol/L (ref 135–146)
TOTAL PROTEIN: 6.6 g/dL (ref 6.1–8.1)
Total Bilirubin: 1.1 mg/dL (ref 0.2–1.2)

## 2015-05-07 LAB — POCT URINALYSIS DIPSTICK
Bilirubin, UA: NEGATIVE
Blood, UA: NEGATIVE
Glucose, UA: NEGATIVE
Ketones, UA: NEGATIVE
LEUKOCYTES UA: NEGATIVE
NITRITE UA: NEGATIVE
PH UA: 6
PROTEIN UA: NEGATIVE
Spec Grav, UA: <=1.005
Urobilinogen, UA: 0.2

## 2015-05-07 LAB — LIPID PANEL
CHOLESTEROL: 179 mg/dL (ref 125–200)
HDL: 51 mg/dL (ref >=46)
LDL Cholesterol: 103 mg/dL (ref <130)
Total CHOL/HDL Ratio: 3.5 Ratio (ref <=5.0)
Triglycerides: 126 mg/dL (ref <150)
VLDL: 25 mg/dL (ref <30)

## 2015-05-07 MED ORDER — VILAZODONE HCL 40 MG PO TABS: 40.0000 mg | ORAL_TABLET | Freq: Every day | ORAL | Status: DC

## 2015-05-07 MED ORDER — ALPRAZOLAM 1 MG PO TABS: ORAL_TABLET | ORAL | Status: DC

## 2015-05-07 NOTE — Patient Instructions (Addendum)
Go back on Vibyyd. Stay on same BP meds. Come back in 6 months. Take vitamin D supplement.

## 2015-05-08 LAB — TSH: TSH: 1.812 u[IU]/mL (ref 0.350–4.500)

## 2015-05-08 LAB — VITAMIN D 25 HYDROXY (VIT D DEFICIENCY, FRACTURES): VIT D 25 HYDROXY: 17 ng/mL — AB (ref 30–100)

## 2015-05-10 ENCOUNTER — Telehealth: Payer: Self-pay | Admitting: Internal Medicine

## 2015-05-10 NOTE — Telephone Encounter (Signed)
Called Drisdol to [facility] in [place].  LM on their refill line for Drisdol 50,000 units.  Take 1 po weekly x 12 weeks.  No refill.   Mailed lab results to patient with instructions to take 50,000 units once weekly.  Patient also instructed to take 2,000 units of Vitamin D3 daily (over the counter). Patient instructed to call the office if she has any questions.

## 2015-05-15 ENCOUNTER — Encounter: Payer: Self-pay | Admitting: Internal Medicine

## 2015-05-15 DIAGNOSIS — E559 Vitamin D deficiency, unspecified: Secondary | ICD-10-CM | POA: Insufficient documentation

## 2015-05-15 NOTE — Progress Notes (Signed)
   Subjective:    Patient ID: Stephanie Hernandez, female    DOB: 04-06-1951, 64 y.o.   MRN: [id number]  HPI 64 year old Female in today for health maintenance exam and evaluation of medical problems. History of essential hypertension, vitamin D deficiency, anxiety. History of insomnia. At one point tried Viibryd for anxiety. Now wants to restart that. Has tried Lexapro in the past and Zoloft. Takes Xanax for anxiety. History of hypertension.   Past medical history: Fractured fifth metatarsal 2004. Colonoscopy in 2008 done in [place] which she says was normal. No history of operations. Patient had Pneumovax immunization at [facility] in 2002. Became menopausal around age 53. Tetanus immunization done April 2012.  Benign left breast biopsy December 2015.  Social history: Single, never married. Completed one year college. 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 Does not smoke or consume alcohol. 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 No children. Resides alone.  Family history: She stayed at home and took care of her elderly mother. Father died at age 62 suddenly. Mother died at age 1 of respiratory failure. No siblings. Maternal uncle with history of colon cancer in his 51s. Maternal grandmother with history of uterine cancer.     Review of Systems  Constitutional: Negative.   All other systems reviewed and are negative.      Objective:   Physical Exam  Constitutional: She appears well-developed and well-nourished. No distress.  HENT:  Head: Normocephalic and atraumatic.  Right Ear: External ear normal.  Left Ear: External ear normal.  Mouth/Throat: Oropharynx is clear and moist. No oropharyngeal exudate.  Eyes: Conjunctivae and EOM are normal. Pupils are equal, round, and reactive to light. Right eye exhibits no discharge. Left eye exhibits no discharge. No scleral icterus.  Neck: Neck supple. No JVD  present. No thyromegaly present.  Cardiovascular: Normal rate, regular rhythm, normal heart sounds and intact distal pulses.   No murmur heard. Pulmonary/Chest: Effort normal and breath sounds normal. No respiratory distress. She has no wheezes. She has no rales.  Abdominal: Soft. Bowel sounds are normal. She exhibits no distension and no mass. There is no tenderness. There is no rebound and no guarding.  Genitourinary:  Pap done 2015. Bimanual today normal.  Lymphadenopathy:    She has no cervical adenopathy.  Skin: Skin is dry. No rash noted. She is not diaphoretic.  Psychiatric: She has a normal mood and affect. Her behavior is normal. Judgment and thought content normal.  Vitals reviewed.         Assessment & Plan:  Normal health maintenance exam  History of benign left breast biopsy December 2015  Anxiety  Essential hypertension-stable  Plan: Restart Viibryd. The same antihypertensive medication.

## 2015-06-09 ENCOUNTER — Other Ambulatory Visit: Payer: Self-pay

## 2015-06-09 DIAGNOSIS — Z1231 Encounter for screening mammogram for malignant neoplasm of breast: Secondary | ICD-10-CM

## 2015-07-05 ENCOUNTER — Ambulatory Visit: Payer: BLUE CROSS/BLUE SHIELD

## 2015-08-02 ENCOUNTER — Ambulatory Visit (INDEPENDENT_AMBULATORY_CARE_PROVIDER_SITE_OTHER): Payer: BLUE CROSS/BLUE SHIELD | Admitting: Internal Medicine

## 2015-08-02 ENCOUNTER — Encounter: Payer: Self-pay | Admitting: Internal Medicine

## 2015-08-02 VITALS — BP 122/82 | HR 80 | Temp 98.1°F | Resp 22 | Wt 180.0 lb

## 2015-08-02 DIAGNOSIS — F411 Generalized anxiety disorder: Secondary | ICD-10-CM | POA: Diagnosis not present

## 2015-08-02 DIAGNOSIS — R030 Elevated blood-pressure reading, without diagnosis of hypertension: Secondary | ICD-10-CM

## 2015-08-02 DIAGNOSIS — Z8659 Personal history of other mental and behavioral disorders: Secondary | ICD-10-CM

## 2015-08-02 DIAGNOSIS — I10 Essential (primary) hypertension: Secondary | ICD-10-CM

## 2015-08-02 DIAGNOSIS — R002 Palpitations: Secondary | ICD-10-CM

## 2015-08-02 DIAGNOSIS — IMO0001 Reserved for inherently not codable concepts without codable children: Secondary | ICD-10-CM

## 2015-08-02 MED ORDER — PINDOLOL 5 MG PO TABS: ORAL_TABLET | ORAL | Status: DC

## 2015-08-06 ENCOUNTER — Ambulatory Visit
Admission: RE | Admit: 2015-08-06 | Discharge: 2015-08-06 | Disposition: A | Discharge status: Home / Self Care | Payer: BLUE CROSS/BLUE SHIELD | Source: Ambulatory Visit

## 2015-08-06 DIAGNOSIS — Z1231 Encounter for screening mammogram for malignant neoplasm of breast: Secondary | ICD-10-CM

## 2015-08-09 ENCOUNTER — Other Ambulatory Visit: Payer: Self-pay | Admitting: Internal Medicine

## 2015-08-21 NOTE — Progress Notes (Signed)
   Subjective:    Patient ID: Stephanie Hernandez, female    DOB: 12/13/1950, 64 y.o.   MRN: [id number]  HPI Long discussion with patient today about anxiety and elevated blood pressure. She gets anxious at home. Thinks she may die alone. Does not like to be at home alone. Blood pressure has been elevated. She thought it might be due to vibrated. I think is due to anxiety. She [place] in [place]. She is considering it if offered. Was in a store and aspirin felt palpitations recently. This was frightening to her.    Review of Systems     Objective:   Physical Exam  Neck is supple without JVD thyromegaly or carotid bruits. Chest clear to auscultation. Cardiac exam regular rate and rhythm normal S1 and S2. Extremities without edema.      Assessment & Plan:  Elevated blood pressure  Anxiety  History depression  History of palpitations  Plan: Restart Vibryd.  Xanax 0.5-1 mg twice daily. Pindolol 5 mg one or 2 by mouth daily. If having palpitations may take extra dose. Continue Cozaar and HCTZ.

## 2015-08-21 NOTE — Patient Instructions (Addendum)
Restart  Vibryd.  Xanax 0.5-1 mg twice daily if needed. May take one or 2 pindolol as needed for palpitations but at least once daily. Continue to monitor blood pressure. Continue HCTZ and losartan. Return in March.

## 2015-08-24 ENCOUNTER — Ambulatory Visit (INDEPENDENT_AMBULATORY_CARE_PROVIDER_SITE_OTHER): Payer: BLUE CROSS/BLUE SHIELD | Admitting: Internal Medicine

## 2015-08-24 ENCOUNTER — Encounter: Payer: Self-pay | Admitting: Internal Medicine

## 2015-08-24 VITALS — BP 120/80 | HR 77 | Temp 98.3°F | Wt 178.0 lb

## 2015-08-24 DIAGNOSIS — G4700 Insomnia, unspecified: Secondary | ICD-10-CM | POA: Diagnosis not present

## 2015-08-24 DIAGNOSIS — F411 Generalized anxiety disorder: Secondary | ICD-10-CM | POA: Diagnosis not present

## 2015-08-24 DIAGNOSIS — R002 Palpitations: Secondary | ICD-10-CM

## 2015-08-24 MED ORDER — TEMAZEPAM 30 MG PO CAPS: 30.0000 mg | ORAL_CAPSULE | Freq: Every evening | ORAL | Status: DC | PRN

## 2015-09-03 ENCOUNTER — Telehealth: Payer: Self-pay | Admitting: Internal Medicine

## 2015-09-03 NOTE — Telephone Encounter (Signed)
Patient expressed that the doctor asked her to call to discuss a medication, but the patient wants to know if the doctors wants to talk on the phone about them or should she make an appt.

## 2015-09-03 NOTE — Telephone Encounter (Signed)
Left message for return call.

## 2015-09-03 NOTE — Telephone Encounter (Signed)
Change Vibryd to Paxil 10 mg at bedtime call in #[phone]

## 2015-09-03 NOTE — Telephone Encounter (Signed)
Please find out if meds are working or does she need medication consult elsewhere as we discussed.

## 2015-09-03 NOTE — Telephone Encounter (Signed)
Patient states that she thinks that she needs a medication consult as the viibryd doesn't seem to be helping her and that it also is disturbing her sleep. She states that she is taking the restoril and is good for about 5 hours and then she just feels foggy the next day. She states that she does not take that every night because she doesn't want to it every night. She states that she is willing to see someone for a medication consultation. She states that if you are willing she would try a different medication for depression if you want to because she is comfortable with you. She is asking about celexa or paxil as she states you and her had discussed these before. She states that she also wants to know if you want her to continue the restoril at night. Please advise.

## 2015-09-06 MED ORDER — PAROXETINE HCL 10 MG PO TABS: 10.0000 mg | ORAL_TABLET | Freq: Every day | ORAL | Status: DC

## 2015-09-06 NOTE — Telephone Encounter (Signed)
Medication sent to pharmacy. Patient notified.

## 2015-09-19 DIAGNOSIS — G4700 Insomnia, unspecified: Secondary | ICD-10-CM | POA: Insufficient documentation

## 2015-09-19 NOTE — Patient Instructions (Addendum)
May need med consult with psychiatrist for management of symptoms. Try Restoril at bedtime. Take Xanax in the morning and mid afternoon. Continue SSRI.

## 2015-09-19 NOTE — Progress Notes (Signed)
   Subjective:    Patient ID: Stephanie Hernandez, female    DOB: [birth year], 65 y.o.   MRN: [id number]  HPI Still having issues with anxiety and insomnia. Thinks Vibryd is causing the symptoms. I think uncontrolled anxiety is causing the problem.  She is afraid of being at home alone and dying. Work continues to be stressful.    Review of Systems palpitations associated with anxiety     Objective:   Physical Exam  Spent 20 minutes speaking with patient about these issues. She may need psychiatric consultation for med management      Assessment & Plan:  Anxiety  Insomnia  Palpitations  Plan: Trial of Restoril at bedtime. Continue Xanax twice a day for anxiety. Suggest she take Xanax before going to work and probably midafternoon since she will be taken Restoril at bedtime. Continue Vibryd

## 2015-10-04 ENCOUNTER — Other Ambulatory Visit: Payer: Self-pay | Admitting: Internal Medicine

## 2015-11-01 ENCOUNTER — Encounter: Payer: Self-pay | Admitting: Internal Medicine

## 2015-11-01 ENCOUNTER — Ambulatory Visit (INDEPENDENT_AMBULATORY_CARE_PROVIDER_SITE_OTHER): Payer: BLUE CROSS/BLUE SHIELD | Admitting: Internal Medicine

## 2015-11-01 VITALS — BP 122/70 | HR 70 | Temp 97.0°F | Resp 16 | Ht 62.0 in | Wt 178.5 lb

## 2015-11-01 DIAGNOSIS — I10 Essential (primary) hypertension: Secondary | ICD-10-CM

## 2015-11-01 DIAGNOSIS — J069 Acute upper respiratory infection, unspecified: Secondary | ICD-10-CM | POA: Diagnosis not present

## 2015-11-01 DIAGNOSIS — F411 Generalized anxiety disorder: Secondary | ICD-10-CM | POA: Diagnosis not present

## 2015-11-01 LAB — BASIC METABOLIC PANEL
BUN: 15 mg/dL (ref 7–25)
CHLORIDE: 100 mmol/L (ref 98–110)
CO2: 28 mmol/L (ref 20–31)
Calcium: 9.8 mg/dL (ref 8.6–10.4)
Creat: 0.8 mg/dL (ref 0.50–0.99)
GLUCOSE: 103 mg/dL — AB (ref 65–99)
POTASSIUM: 5.1 mmol/L (ref 3.5–5.3)
Sodium: 137 mmol/L (ref 135–146)

## 2015-11-01 MED ORDER — ALPRAZOLAM 1 MG PO TABS: 0.5000 mg | ORAL_TABLET | Freq: Two times a day (BID) | ORAL | Status: DC | PRN

## 2015-11-01 MED ORDER — PAROXETINE HCL 10 MG PO TABS: 10.0000 mg | ORAL_TABLET | Freq: Every day | ORAL | Status: DC

## 2015-11-01 MED ORDER — AZITHROMYCIN 250 MG PO TABS: ORAL_TABLET | ORAL | Status: DC

## 2015-11-01 NOTE — Progress Notes (Signed)
   Subjective:    Patient ID: Stephanie BoyerJudy Hernandez, female    DOB: Oct 25, 1950, 65 y.o.   MRN: [id number]  HPI In today for six-month recheck on anxiety and essential hypertension. Blood pressures under excellent control at the present time. She started Paxil recently and is sleeping well and feeling much less anxious. Continues to take Xanax usually once a day but sometimes twice a day. Paxil has been the best SSRI she has tried. She's worried about long-term side effects but she was reassured. She looks much more rested and feels better.  She has come down with acute respiratory infection with coughing. Beginning to feel a little rattle in her upper airway. No fever or shaking chills.    Review of Systems see above     Objective:   Physical Exam  Skin warm and dry. Nodes none. TMs and pharynx are clear. Neck is supple without adenopathy. Chest clear to auscultation without rales or wheezing      Assessment & Plan:  Acute URI-take Zithromax Z-PAK and continue over-the-counter medications as needed  Anxiety-continue Paxil. Xanax refilled  Essential hypertension-stable on current regimen  Plan: Return late September for physical examination.

## 2015-11-01 NOTE — Patient Instructions (Addendum)
It was a pleasure to see you today. Continue same meds. RTC in September. Take Z-pak for URI

## 2016-02-14 ENCOUNTER — Other Ambulatory Visit: Payer: Self-pay | Admitting: Internal Medicine

## 2016-04-16 ENCOUNTER — Other Ambulatory Visit: Payer: Self-pay | Admitting: Internal Medicine

## 2016-05-12 ENCOUNTER — Ambulatory Visit (INDEPENDENT_AMBULATORY_CARE_PROVIDER_SITE_OTHER): Payer: BLUE CROSS/BLUE SHIELD | Admitting: Internal Medicine

## 2016-05-12 ENCOUNTER — Encounter: Payer: Self-pay | Admitting: Internal Medicine

## 2016-05-12 ENCOUNTER — Other Ambulatory Visit: Payer: Self-pay | Admitting: Internal Medicine

## 2016-05-12 VITALS — BP 138/88 | HR 70 | Ht 62.0 in | Wt 185.0 lb

## 2016-05-12 DIAGNOSIS — I10 Essential (primary) hypertension: Secondary | ICD-10-CM

## 2016-05-12 DIAGNOSIS — E669 Obesity, unspecified: Secondary | ICD-10-CM

## 2016-05-12 DIAGNOSIS — F411 Generalized anxiety disorder: Secondary | ICD-10-CM

## 2016-05-12 DIAGNOSIS — Z0000 Encounter for general adult medical examination without abnormal findings: Secondary | ICD-10-CM

## 2016-05-12 DIAGNOSIS — R635 Abnormal weight gain: Secondary | ICD-10-CM | POA: Diagnosis not present

## 2016-05-12 DIAGNOSIS — R002 Palpitations: Secondary | ICD-10-CM

## 2016-05-12 DIAGNOSIS — R739 Hyperglycemia, unspecified: Secondary | ICD-10-CM | POA: Diagnosis not present

## 2016-05-12 DIAGNOSIS — N6012 Diffuse cystic mastopathy of left breast: Secondary | ICD-10-CM

## 2016-05-12 LAB — COMPREHENSIVE METABOLIC PANEL
ALBUMIN: 4.3 g/dL (ref 3.6–5.1)
ALT: 19 U/L (ref 6–29)
AST: 21 U/L (ref 10–35)
Alkaline Phosphatase: 84 U/L (ref 33–130)
BUN: 14 mg/dL (ref 7–25)
CHLORIDE: 101 mmol/L (ref 98–110)
CO2: 25 mmol/L (ref 20–31)
Calcium: 10 mg/dL (ref 8.6–10.4)
Creat: 0.88 mg/dL (ref 0.50–0.99)
Glucose, Bld: 108 mg/dL — ABNORMAL HIGH (ref 65–99)
Potassium: 4.7 mmol/L (ref 3.5–5.3)
SODIUM: 136 mmol/L (ref 135–146)
Total Bilirubin: 0.9 mg/dL (ref 0.2–1.2)
Total Protein: 7 g/dL (ref 6.1–8.1)

## 2016-05-12 LAB — POCT URINALYSIS DIPSTICK
Bilirubin, UA: NEGATIVE
Blood, UA: NEGATIVE
GLUCOSE UA: NEGATIVE
Ketones, UA: NEGATIVE
Leukocytes, UA: NEGATIVE
NITRITE UA: NEGATIVE
Protein, UA: NEGATIVE
UROBILINOGEN UA: NEGATIVE
pH, UA: 7.5

## 2016-05-12 LAB — LIPID PANEL
CHOL/HDL RATIO: 3.5 ratio (ref <=5.0)
Cholesterol: 170 mg/dL (ref 125–200)
HDL: 49 mg/dL (ref >=46)
LDL CALC: 94 mg/dL (ref <130)
Triglycerides: 137 mg/dL (ref <150)
VLDL: 27 mg/dL (ref <30)

## 2016-05-12 LAB — CBC WITH DIFFERENTIAL/PLATELET
BASOS ABS: 0 {cells}/uL (ref 0–200)
Basophils Relative: 0 %
EOS PCT: 1 %
Eosinophils Absolute: 77 cells/uL (ref 15–500)
HCT: 41.7 % (ref 35.0–45.0)
Hemoglobin: 13.8 g/dL (ref 11.7–15.5)
LYMPHS PCT: 24 %
Lymphs Abs: 1848 cells/uL (ref 850–3900)
MCH: 30.5 pg (ref 27.0–33.0)
MCHC: 33.1 g/dL (ref 32.0–36.0)
MCV: 92.1 fL (ref 80.0–100.0)
MONOS PCT: 5 %
MPV: 11.1 fL (ref 7.5–12.5)
Monocytes Absolute: 385 cells/uL (ref 200–950)
NEUTROS PCT: 70 %
Neutro Abs: 5390 cells/uL (ref 1500–7800)
Platelets: 327 10*3/uL (ref 140–400)
RBC: 4.53 MIL/uL (ref 3.80–5.10)
RDW: 13.4 % (ref 11.0–15.0)
WBC: 7.7 10*3/uL (ref 3.8–10.8)

## 2016-05-12 LAB — TSH: TSH: 1.61 m[IU]/L

## 2016-05-12 MED ORDER — AZITHROMYCIN 250 MG PO TABS: ORAL_TABLET | ORAL | 0 refills | Status: DC

## 2016-05-12 MED ORDER — PAROXETINE HCL 10 MG PO TABS: 10.0000 mg | ORAL_TABLET | Freq: Every day | ORAL | 5 refills | Status: DC

## 2016-05-12 MED ORDER — ALPRAZOLAM 1 MG PO TABS: 0.5000 mg | ORAL_TABLET | Freq: Two times a day (BID) | ORAL | 5 refills | Status: DC | PRN

## 2016-05-12 MED ORDER — LOSARTAN POTASSIUM 25 MG PO TABS: 25.0000 mg | ORAL_TABLET | Freq: Every day | ORAL | 5 refills | Status: DC

## 2016-05-12 NOTE — Progress Notes (Signed)
   Subjective:    Patient ID: Stephanie Hernandez, female    DOB: 12-Sep-1950, 65 y.o.   MRN: [id number]  HPI  [age] year year old Female for health maintenance exam and evaluation of medical issues.She has a history of anxiety and hypertension. Also history of palpitations. Since starting Paxil, symptoms have improved greatly. However she's gained some weight and is frustrated with that. Has gained about 9 pounds. She doesn't exercise. Is tired when she gets home. Talked about walking which she is able to do.  Past medical history: Fractured fifth metatarsal 2004. [place] in [place] which she says was normal. No history of operations. Patient had Pneumovax immunization [facility] in 2002. Became menopausal around age 65. Tetanus immunization done April 2012.  Benign left breast biopsy December 2015.  Social history: Single, never married. Completed one year college. 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 Does not smoke or consume alcohol. 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 No children. Resides alone.  Family history: She stated home and took care of her elderly mother. Father died at age 63 suddenly. Mother died at age 63 of respiratory failure. No siblings. Maternal uncle with history of colon cancer in his 2460s. Maternal grandmother with history of uterine cancer.    Review of Systems  Constitutional:        Nine pound weight gain  HENT: Trouble swallowing:     Respiratory: Negative.   Cardiovascular:       Palpitations improved  Gastrointestinal: Negative.   Genitourinary: Negative.   Musculoskeletal: Negative.   Neurological: Negative.   Psychiatric/Behavioral:       History of anxiety  All other systems reviewed and are negative.      Objective:   Physical Exam  Constitutional: She appears well-developed and well-nourished.  HENT:  Head: Normocephalic and atraumatic.    Genitourinary:  Genitourinary Comments: Bimanual normal. Pap performed in 2015.  Vitals reviewed.         Assessment & Plan:  Hypertension stable  Anxiety due to stable and improved  Elevated serum glucose-hemoglobin A1c  Obesity-advise diet and exercise  Plan: Gave her Zithromax Z-Pak prescription to have on hand since [place]. Refill medications approved for 6 months. Return in 6 months. Will need hemoglobin A1c at that time.

## 2016-05-13 LAB — VITAMIN D 25 HYDROXY (VIT D DEFICIENCY, FRACTURES): VIT D 25 HYDROXY: 32 ng/mL (ref 30–100)

## 2016-05-13 NOTE — Patient Instructions (Signed)
It was a pleasure to see you today. Continue same medications and return in 6 months. Hemoglobin A1c added to labs drawn.

## 2016-05-15 ENCOUNTER — Other Ambulatory Visit: Payer: Self-pay | Admitting: Internal Medicine

## 2016-05-15 DIAGNOSIS — Z1231 Encounter for screening mammogram for malignant neoplasm of breast: Secondary | ICD-10-CM

## 2016-05-16 LAB — HEMOGLOBIN A1C
HEMOGLOBIN A1C: 5.2 % (ref <5.7)
Mean Plasma Glucose: 103 mg/dL

## 2016-08-07 ENCOUNTER — Ambulatory Visit
Admission: RE | Admit: 2016-08-07 | Discharge: 2016-08-07 | Disposition: A | Discharge status: Home / Self Care | Payer: BLUE CROSS/BLUE SHIELD | Source: Ambulatory Visit | Attending: Internal Medicine | Admitting: Internal Medicine

## 2016-08-07 DIAGNOSIS — Z1231 Encounter for screening mammogram for malignant neoplasm of breast: Secondary | ICD-10-CM

## 2016-11-13 ENCOUNTER — Other Ambulatory Visit: Payer: Self-pay | Admitting: Internal Medicine

## 2016-11-13 NOTE — Telephone Encounter (Signed)
Refill x 90 days 

## 2016-11-24 ENCOUNTER — Encounter: Payer: Self-pay | Admitting: Internal Medicine

## 2016-11-24 ENCOUNTER — Ambulatory Visit (INDEPENDENT_AMBULATORY_CARE_PROVIDER_SITE_OTHER): Payer: BLUE CROSS/BLUE SHIELD | Admitting: Internal Medicine

## 2016-11-24 VITALS — BP 112/80 | HR 64 | Temp 97.4°F | Ht 62.0 in | Wt 190.0 lb

## 2016-11-24 DIAGNOSIS — F411 Generalized anxiety disorder: Secondary | ICD-10-CM | POA: Diagnosis not present

## 2016-11-24 DIAGNOSIS — I10 Essential (primary) hypertension: Secondary | ICD-10-CM

## 2016-11-24 DIAGNOSIS — G4700 Insomnia, unspecified: Secondary | ICD-10-CM

## 2016-11-24 MED ORDER — AZITHROMYCIN 250 MG PO TABS: ORAL_TABLET | ORAL | 0 refills | Status: DC

## 2016-11-24 NOTE — Progress Notes (Signed)
   Subjective:    Patient ID: Stephanie Hernandez, female    DOB: 1951/05/07, 66 y.o.   MRN: [id number]  HPI   66 year old  Female for 6 month recheck. History of essential hypertension, anxiety, insomnia. She did well through the when her. She had of mild respiratory infection and took a Z-Pak just one time.  She thinks she needs to have a colonoscopy. Says Dr. Volney Presser  in [place] did the last one probably around 2008. I do not have that report on file in Epic.However I checked her old paper chart and reviewed records from [facility]. Patient told me when she established here that she had a colonoscopy in 2008 and I wrote that down in the paper chart but I do not see a procedure report.    Review of Systems see above-seems less anxious     Objective:   Physical Exam  Constitutional: She appears well-developed and well-nourished. No distress.  Neck: Neck supple. No JVD present. No thyromegaly present.  Cardiovascular: Normal rate, regular rhythm and normal heart sounds.   No murmur heard. Pulmonary/Chest: Effort normal and breath sounds normal. No respiratory distress. She has no wheezes.  Musculoskeletal: She exhibits no edema.  Lymphadenopathy:    She has no cervical adenopathy.  Skin: Skin is warm and dry. She is not diaphoretic.  Psychiatric: She has a normal mood and affect. Her behavior is normal. Judgment and thought content normal.  Vitals reviewed.         Assessment & Plan:  Essential hypertension-stable on current regimen  Anxiety-stable  Plan: I think she is doing very well. She may return in 6 months for physical examination and she will check with Dr. Darlen Round office about date of her last colonoscopy.

## 2016-12-11 ENCOUNTER — Other Ambulatory Visit: Payer: Self-pay | Admitting: Internal Medicine

## 2016-12-13 NOTE — Patient Instructions (Signed)
It was a pleasure to see you today.  Continue same medications and return in 6 months. 

## 2017-01-11 ENCOUNTER — Telehealth: Payer: Self-pay | Admitting: Internal Medicine

## 2017-01-11 NOTE — Telephone Encounter (Signed)
LMTCB

## 2017-01-11 NOTE — Telephone Encounter (Signed)
Informed patient that she could have the A1C if she liked but she wanted to know if it seemed worrisome. I told her the normal ranges and how she does fall within them still and that everyone is different so their normal range for glucose can vary person to person. She stated she understood and would watch for symptoms of a high blood sugar. She would like an A1C added to her physical lab work in October.

## 2017-01-11 NOTE — Telephone Encounter (Signed)
Patient had screening at work for glucose.  Finger stick was 116.  She had not had anything to eat since midnight and had only had water to drink.  Patient wanted to know if she should be seen prior to her appointment for CPE in October (05/25/17).  Advised that I would pass the message along to you.  However, her A1C 05/12/16 was 5.2 and glucose was 108 at that time.  Thank you.

## 2017-01-11 NOTE — Telephone Encounter (Signed)
She may come have another AIC drawn if she would like.

## 2017-02-05 ENCOUNTER — Other Ambulatory Visit: Payer: Self-pay | Admitting: Internal Medicine

## 2017-02-09 ENCOUNTER — Other Ambulatory Visit: Payer: Self-pay | Admitting: Internal Medicine

## 2017-05-25 ENCOUNTER — Other Ambulatory Visit (HOSPITAL_COMMUNITY)
Admission: RE | Admit: 2017-05-25 | Discharge: 2017-05-25 | Disposition: A | Discharge status: Home / Self Care | Payer: BLUE CROSS/BLUE SHIELD | Source: Ambulatory Visit | Attending: Internal Medicine | Admitting: Internal Medicine

## 2017-05-25 ENCOUNTER — Encounter: Payer: Self-pay | Admitting: Internal Medicine

## 2017-05-25 ENCOUNTER — Ambulatory Visit (INDEPENDENT_AMBULATORY_CARE_PROVIDER_SITE_OTHER): Payer: BLUE CROSS/BLUE SHIELD | Admitting: Internal Medicine

## 2017-05-25 VITALS — BP 138/82 | HR 73 | Temp 98.1°F | Ht 62.0 in | Wt 190.0 lb

## 2017-05-25 DIAGNOSIS — F32A Depression, unspecified: Secondary | ICD-10-CM

## 2017-05-25 DIAGNOSIS — F419 Anxiety disorder, unspecified: Secondary | ICD-10-CM | POA: Diagnosis not present

## 2017-05-25 DIAGNOSIS — F329 Major depressive disorder, single episode, unspecified: Secondary | ICD-10-CM

## 2017-05-25 DIAGNOSIS — E559 Vitamin D deficiency, unspecified: Secondary | ICD-10-CM | POA: Diagnosis not present

## 2017-05-25 DIAGNOSIS — Z0000 Encounter for general adult medical examination without abnormal findings: Secondary | ICD-10-CM | POA: Insufficient documentation

## 2017-05-25 DIAGNOSIS — Z87898 Personal history of other specified conditions: Secondary | ICD-10-CM | POA: Diagnosis not present

## 2017-05-25 DIAGNOSIS — G4700 Insomnia, unspecified: Secondary | ICD-10-CM | POA: Diagnosis not present

## 2017-05-25 DIAGNOSIS — I10 Essential (primary) hypertension: Secondary | ICD-10-CM | POA: Diagnosis not present

## 2017-05-25 LAB — POCT URINALYSIS DIPSTICK
BILIRUBIN UA: NEGATIVE
Blood, UA: NEGATIVE
Glucose, UA: NEGATIVE
Ketones, UA: NEGATIVE
LEUKOCYTES UA: NEGATIVE
Nitrite, UA: NEGATIVE
PH UA: 6 (ref 5.0–8.0)
Protein, UA: NEGATIVE
SPEC GRAV UA: 1.025 (ref 1.010–1.025)
Urobilinogen, UA: 0.2 E.U./dL

## 2017-05-25 NOTE — Progress Notes (Signed)
   Subjective:    Patient ID: Stephanie Hernandez, female    DOB: 06-01-1951, 66 y.o.   MRN: [id number]  HPI 66 year old Female for health maintenance exam and evaluation of medical issues including hypertension and anxiety.  Developed Herpes zoster Sept 23. Went to urgent care and was given Valtrex. Says she had a prior episode a number of years ago. Prescription given for Shingrix vaccine to be started in about 6 months.  Had colonoscopy in [facility] on August 23 and no lesions were noted. 10 year follow-up recommended.  To get flu vaccine at work.  History of palpitations which improved after starting Paxil.  Does not exercise much.  Is tired when she gets home.  Past medical history: Fractured fifth metatarsal 2004.  Previous colonoscopy 2008 [place] in [place] was normal.  Became menopausal around age 12.  Tetanus immunization done April 2012.  No history of operations except for benign left breast biopsy December 2015.  Social history: Single, never married.  Completed 1 year of college.  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  She is Diplomatic Services operational officer for [employer].  Does not smoke or consume alcohol.  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  No children.  Resides alone.  Family history: She stayed at home and took care of her elderly mother.  Mother died at age 50 of respiratory failure.  Father died at age 52 suddenly.  Maternal uncle with history of colon cancer in his 6s.  Maternal grandmother with history of uterine cancer.       Review of Systems  Constitutional: Positive for fatigue.  Respiratory: Negative.   Cardiovascular: Negative.   Gastrointestinal: Negative.   Genitourinary: Negative.   Neurological: Negative.        Objective:   Physical Exam  Constitutional: She is oriented to person, place, and time. She appears well-developed and well-nourished. No distress.  HENT:  Head: Normocephalic and atraumatic.  Right Ear: External  ear normal.  Left Ear: External ear normal.  Mouth/Throat: Oropharynx is clear and moist.  Eyes: Pupils are equal, round, and reactive to light. Conjunctivae and EOM are normal. Right eye exhibits no discharge. Left eye exhibits no discharge.  Neck: Neck supple. No JVD present. No thyromegaly present.  Cardiovascular: Normal rate, regular rhythm, normal heart sounds and intact distal pulses.   No murmur heard. Pulmonary/Chest:  Breasts normal female without masses  Abdominal: Soft. Bowel sounds are normal. She exhibits no distension and no mass. There is no tenderness. There is no rebound and no guarding.  Genitourinary:  Genitourinary Comments: Pap taken.  Bimanual normal.  Musculoskeletal: She exhibits no edema.  Lymphadenopathy:    She has no cervical adenopathy.  Neurological: She is alert and oriented to person, place, and time. She has normal reflexes. No cranial nerve deficit.  Skin: Skin is warm and dry. No rash noted. She is not diaphoretic.  Psychiatric: She has a normal mood and affect. Her behavior is normal. Judgment and thought content normal.  Vitals reviewed.         Assessment & Plan:  Essential hypertension-stable  Anxiety depression-stable on Xanax and Paxil  Vitamin D deficiency-take 2000 units vitamin D3 daily  Insomnia has Xanax to take  Health maintenance-have annual flu vaccine and mammogram  History of palpitations-improved  Plan: Continue current medications and return in 6 months.

## 2017-05-26 LAB — COMPLETE METABOLIC PANEL WITH GFR
AG Ratio: 1.8 (calc) (ref 1.0–2.5)
ALBUMIN MSPROF: 4.4 g/dL (ref 3.6–5.1)
ALT: 24 U/L (ref 6–29)
AST: 20 U/L (ref 10–35)
Alkaline phosphatase (APISO): 101 U/L (ref 33–130)
BILIRUBIN TOTAL: 0.9 mg/dL (ref 0.2–1.2)
BUN: 16 mg/dL (ref 7–25)
CHLORIDE: 99 mmol/L (ref 98–110)
CO2: 29 mmol/L (ref 20–32)
CREATININE: 0.86 mg/dL (ref 0.50–0.99)
Calcium: 10 mg/dL (ref 8.6–10.4)
GFR, EST AFRICAN AMERICAN: 83 mL/min/{1.73_m2} (ref >=60)
GFR, Est Non African American: 71 mL/min/{1.73_m2} (ref >=60)
GLOBULIN: 2.5 g/dL (ref 1.9–3.7)
GLUCOSE: 110 mg/dL — AB (ref 65–99)
Potassium: 4.9 mmol/L (ref 3.5–5.3)
Sodium: 135 mmol/L (ref 135–146)
Total Protein: 6.9 g/dL (ref 6.1–8.1)

## 2017-05-26 LAB — CBC WITH DIFFERENTIAL/PLATELET
BASOS PCT: 0.2 %
Basophils Absolute: 17 cells/uL (ref 0–200)
EOS ABS: 92 {cells}/uL (ref 15–500)
Eosinophils Relative: 1.1 %
HEMATOCRIT: 41.3 % (ref 35.0–45.0)
HEMOGLOBIN: 13.9 g/dL (ref 11.7–15.5)
LYMPHS ABS: 1898 {cells}/uL (ref 850–3900)
MCH: 30.6 pg (ref 27.0–33.0)
MCHC: 33.7 g/dL (ref 32.0–36.0)
MCV: 91 fL (ref 80.0–100.0)
MONOS PCT: 6 %
MPV: 11.3 fL (ref 7.5–12.5)
NEUTROS ABS: 5888 {cells}/uL (ref 1500–7800)
Neutrophils Relative %: 70.1 %
Platelets: 318 10*3/uL (ref 140–400)
RBC: 4.54 10*6/uL (ref 3.80–5.10)
RDW: 13 % (ref 11.0–15.0)
Total Lymphocyte: 22.6 %
WBC: 8.4 10*3/uL (ref 3.8–10.8)
WBCMIX: 504 {cells}/uL (ref 200–950)

## 2017-05-26 LAB — LIPID PANEL
CHOL/HDL RATIO: 3.6 (calc) (ref <5.0)
Cholesterol: 179 mg/dL (ref <200)
HDL: 50 mg/dL — ABNORMAL LOW (ref >50)
LDL CHOLESTEROL (CALC): 104 mg/dL — AB
Non-HDL Cholesterol (Calc): 129 mg/dL (calc) (ref <130)
TRIGLYCERIDES: 148 mg/dL (ref <150)

## 2017-05-26 LAB — VITAMIN D 25 HYDROXY (VIT D DEFICIENCY, FRACTURES): VIT D 25 HYDROXY: 28 ng/mL — AB (ref 30–100)

## 2017-05-26 LAB — TSH: TSH: 2.02 m[IU]/L (ref 0.40–4.50)

## 2017-05-28 ENCOUNTER — Other Ambulatory Visit: Payer: Self-pay | Admitting: Internal Medicine

## 2017-05-28 DIAGNOSIS — Z1231 Encounter for screening mammogram for malignant neoplasm of breast: Secondary | ICD-10-CM

## 2017-05-29 LAB — CYTOLOGY - PAP: DIAGNOSIS: NEGATIVE

## 2017-06-09 ENCOUNTER — Encounter: Payer: Self-pay | Admitting: Internal Medicine

## 2017-06-18 NOTE — Patient Instructions (Signed)
It was a pleasure to see you today.  Please work on diet exercise and weight loss.  Continue same medications.  Take 2000 units vitamin D3 daily.  Have annual mammogram and annual flu vaccine.  Return in 6 months.

## 2017-07-03 ENCOUNTER — Encounter: Payer: Self-pay | Admitting: Internal Medicine

## 2017-07-03 MED ORDER — AZITHROMYCIN 250 MG PO TABS: ORAL_TABLET | ORAL | 0 refills | Status: DC

## 2017-07-03 NOTE — Telephone Encounter (Signed)
Calling in Z-pak for sinusitis at pt request

## 2017-08-09 ENCOUNTER — Ambulatory Visit: Payer: BLUE CROSS/BLUE SHIELD

## 2017-09-07 ENCOUNTER — Ambulatory Visit
Admission: RE | Admit: 2017-09-07 | Discharge: 2017-09-07 | Disposition: A | Discharge status: Home / Self Care | Payer: BLUE CROSS/BLUE SHIELD | Source: Ambulatory Visit | Attending: Internal Medicine | Admitting: Internal Medicine

## 2017-09-07 DIAGNOSIS — Z1231 Encounter for screening mammogram for malignant neoplasm of breast: Secondary | ICD-10-CM

## 2017-09-11 DIAGNOSIS — L918 Other hypertrophic disorders of the skin: Secondary | ICD-10-CM | POA: Diagnosis not present

## 2017-09-11 DIAGNOSIS — Z6835 Body mass index (BMI) 35.0-35.9, adult: Secondary | ICD-10-CM | POA: Diagnosis not present

## 2017-10-13 DIAGNOSIS — J069 Acute upper respiratory infection, unspecified: Secondary | ICD-10-CM | POA: Diagnosis not present

## 2017-10-19 ENCOUNTER — Other Ambulatory Visit: Payer: Self-pay

## 2017-10-19 MED ORDER — PINDOLOL 5 MG PO TABS: ORAL_TABLET | ORAL | 11 refills | Status: DC

## 2017-10-22 ENCOUNTER — Other Ambulatory Visit: Payer: Self-pay

## 2017-10-22 MED ORDER — PINDOLOL 5 MG PO TABS: ORAL_TABLET | ORAL | 3 refills | Status: DC

## 2017-10-29 DIAGNOSIS — J0100 Acute maxillary sinusitis, unspecified: Secondary | ICD-10-CM | POA: Diagnosis not present

## 2017-11-09 ENCOUNTER — Ambulatory Visit: Payer: BLUE CROSS/BLUE SHIELD | Admitting: Internal Medicine

## 2017-11-16 ENCOUNTER — Encounter: Payer: Self-pay | Admitting: Internal Medicine

## 2017-11-16 ENCOUNTER — Ambulatory Visit (INDEPENDENT_AMBULATORY_CARE_PROVIDER_SITE_OTHER): Payer: BLUE CROSS/BLUE SHIELD | Admitting: Internal Medicine

## 2017-11-16 VITALS — BP 120/80 | HR 71 | Ht 62.0 in | Wt 190.0 lb

## 2017-11-16 DIAGNOSIS — F329 Major depressive disorder, single episode, unspecified: Secondary | ICD-10-CM | POA: Diagnosis not present

## 2017-11-16 DIAGNOSIS — J329 Chronic sinusitis, unspecified: Secondary | ICD-10-CM

## 2017-11-16 DIAGNOSIS — I10 Essential (primary) hypertension: Secondary | ICD-10-CM | POA: Diagnosis not present

## 2017-11-16 DIAGNOSIS — F32A Depression, unspecified: Secondary | ICD-10-CM

## 2017-11-16 DIAGNOSIS — F419 Anxiety disorder, unspecified: Secondary | ICD-10-CM

## 2017-11-16 DIAGNOSIS — F439 Reaction to severe stress, unspecified: Secondary | ICD-10-CM | POA: Diagnosis not present

## 2017-11-16 DIAGNOSIS — R739 Hyperglycemia, unspecified: Secondary | ICD-10-CM | POA: Diagnosis not present

## 2017-11-16 MED ORDER — METHYLPREDNISOLONE ACETATE 80 MG/ML IJ SUSP
80.0000 mg | Freq: Once | INTRAMUSCULAR | Status: AC
  Administered 2017-11-16: 80 mg via INTRAMUSCULAR

## 2017-11-16 MED ORDER — ALPRAZOLAM 1 MG PO TABS: 1.0000 mg | ORAL_TABLET | Freq: Three times a day (TID) | ORAL | 5 refills | Status: DC

## 2017-11-16 MED ORDER — DOXYCYCLINE HYCLATE 100 MG PO TABS: 100.0000 mg | ORAL_TABLET | Freq: Two times a day (BID) | ORAL | 0 refills | Status: DC

## 2017-11-16 MED ORDER — PAROXETINE HCL 10 MG PO TABS: 10.0000 mg | ORAL_TABLET | Freq: Every day | ORAL | 3 refills | Status: DC

## 2017-11-16 NOTE — Patient Instructions (Signed)
Doxycycline 100 mg twice daily for 10 days.  Depo-Medrol 80 mg IM.  Hemoglobin A1c drawn and pending.  Increase Xanax to 1 mg 3 times a day if needed.  Continue Paxil.

## 2017-11-16 NOTE — Progress Notes (Signed)
   Subjective:    Patient ID: Stephanie Hernandez, female    DOB: 1950-12-15, 67 y.o.   MRN: [id number]  HPI 67 year old Female in today for 8268-month recheck.  In the interim she has had considerable problems with sinusitis.  She was seen at [place] in [place] in February and was placed on a Z-Pak and was given an injection of Kenalog.  She did not improve. Subsequently was seen at [place] and was placed on amoxicillin.  Still has had some nasal congestion with some discolored nasal drainage.  Sounds a little bit congested when she speaks.  Thinks she is not completely well.  [place] [facility] [place] [place] which was normal.  She is also had considerable stress at work.  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  She has not done this before and it is stressful for her.  We are going to increase her Xanax up to 1 mg 3 times daily if needed.  She tried stopping her Paxil because she thought it was causing some throat irritation.  Someone suggested she take omeprazole for 14 days and that seemed to help.  I doubt it was the Paxil.  It could have been reflux or anxiety.  I would like her to stay on her Paxil and have refilled it.  Says she has a lot going on because she is considering selling some of her family property.  Still worries a lot.  She thinks she may want to work another year or so before retiring.    Review of Systems see above-blood pressure is stable on current regimen.  She had an elevated serum glucose of 110 in October and we are going to do a hemoglobin A1c today.     Objective:   Physical Exam Skin warm and dry.  Nodes none.  TMs are clear.  Pharynx is clear.  Frontal sinuses transilluminate nicely.  Neck is supple.  Chest clear to auscultation.  She sounds nasally congested when she speaks.       Assessment & Plan:  Persistent maxillary sinusitis-doxycycline 100 mg twice daily for 10 days.  Depo-Medrol 80 mg  IM.  Essential hypertension-continue same regimen  Anxiety see below  Situational stress-increase Xanax up to 1 mg 3 times daily if needed for stress and anxiety  History of elevated serum glucose-hemoglobin A1c drawn and pending.  Return in 6 months for physical exam.

## 2017-11-16 NOTE — Progress Notes (Deleted)
   Subjective:    Patient ID: Stephanie Hernandez, female    DOB: 11/22/1950, 67 y.o.   MRN: [id number]  HPI    Review of Systems     Objective:   Physical Exam        Assessment & Plan:   

## 2017-11-16 NOTE — Progress Notes (Deleted)
   Subjective:    Patient ID: Stephanie Hernandez, female    DOB: [birth year], 67 y.o.   MRN: [id number]  HPI    Review of Systems     Objective:   Physical Exam        Assessment & Plan:

## 2017-11-17 LAB — HEMOGLOBIN A1C
EAG (MMOL/L): 5.8 (calc)
Hgb A1c MFr Bld: 5.3 % of total Hgb (ref <5.7)
MEAN PLASMA GLUCOSE: 105 (calc)

## 2017-11-20 ENCOUNTER — Ambulatory Visit: Payer: BLUE CROSS/BLUE SHIELD | Admitting: Internal Medicine

## 2017-12-04 ENCOUNTER — Telehealth: Payer: Self-pay | Admitting: Internal Medicine

## 2017-12-04 NOTE — Telephone Encounter (Signed)
After Dr Lenord FellersBaxley spoke with patient, she ask me to call and give Darel HongJudy the following information. Dr Lenord FellersBaxley called and left message with Lindaann SloughLisa Poulas to call patient. I also ask patient to call Lindaann SloughLisa Poulas and set up an appointment. Patient stated she understood and would call.  [facility] Lindaann SloughLisa Poulas [address] [place], [place] [address] [phone]

## 2017-12-04 NOTE — Telephone Encounter (Signed)
Stephanie BoyerJudy Hernandez Self [phone]  Stephanie HongJudy called to say she thinks she is having a reaction to increasing her medication Paxiel and Xanax, she feels like she may be having panic attach or hyperventilating.

## 2018-01-25 DIAGNOSIS — F331 Major depressive disorder, recurrent, moderate: Secondary | ICD-10-CM | POA: Diagnosis not present

## 2018-01-25 DIAGNOSIS — F411 Generalized anxiety disorder: Secondary | ICD-10-CM | POA: Diagnosis not present

## 2018-01-27 ENCOUNTER — Other Ambulatory Visit: Payer: Self-pay | Admitting: Internal Medicine

## 2018-02-01 ENCOUNTER — Ambulatory Visit: Payer: BLUE CROSS/BLUE SHIELD | Admitting: Internal Medicine

## 2018-02-01 ENCOUNTER — Telehealth: Payer: Self-pay | Admitting: Internal Medicine

## 2018-02-01 ENCOUNTER — Encounter: Payer: Self-pay | Admitting: Internal Medicine

## 2018-02-01 VITALS — BP 128/80 | HR 84 | Temp 98.2°F | Ht 62.0 in | Wt 175.0 lb

## 2018-02-01 DIAGNOSIS — F439 Reaction to severe stress, unspecified: Secondary | ICD-10-CM

## 2018-02-01 DIAGNOSIS — F411 Generalized anxiety disorder: Secondary | ICD-10-CM

## 2018-02-01 DIAGNOSIS — F410 Panic disorder [episodic paroxysmal anxiety] without agoraphobia: Secondary | ICD-10-CM

## 2018-02-01 NOTE — Telephone Encounter (Signed)
Can Darel HongJudy come at 4:45 pm today?

## 2018-02-01 NOTE — Telephone Encounter (Signed)
Spoke with patient and offered appointment for this afternoon at 4:45.  Patient confirmed.

## 2018-02-01 NOTE — Telephone Encounter (Signed)
States that she saw Stephanie SloughLisa Hernandez and she's having some side effects from the medication that she put her on.  Celexa is the medication that she's currently on.  She's only been on it for 1 week.  She called Stephanie's office yesterday to speak with her.  Someone called her last night and told Stephanie Hernandez that they had forwarded it to Stephanie Hernandez, but they couldn't tell Stephanie Hernandez if Stephanie Hernandez would be calling her today or not.    At this point, Stephanie Hernandez says that she does feel that she may need to be out of work for a period of time.  She wants to know if you have some time today if you would call her since you do not have any time to see her today.    Best phone #:  [phone]  Thank you.

## 2018-02-04 ENCOUNTER — Encounter: Payer: Self-pay | Admitting: Internal Medicine

## 2018-02-04 ENCOUNTER — Telehealth: Payer: Self-pay | Admitting: Internal Medicine

## 2018-02-04 NOTE — Telephone Encounter (Signed)
Pt called Stephanie Hernandez and requested leave of absence from work beginning today and lasting apprx 4 weeks. Was here Friday June 14 to discuss anxiety and situational stress. Is seeing Ellis SavageLisa Hernandez, Psychologist

## 2018-02-15 DIAGNOSIS — F331 Major depressive disorder, recurrent, moderate: Secondary | ICD-10-CM | POA: Diagnosis not present

## 2018-02-15 DIAGNOSIS — F411 Generalized anxiety disorder: Secondary | ICD-10-CM | POA: Diagnosis not present

## 2018-02-16 NOTE — Patient Instructions (Signed)
Continue Xanax as needed for anxiety and continue with Celexa.  Let us know if we need to complete FMLA paperwork for you.

## 2018-02-16 NOTE — Progress Notes (Signed)
   Subjective:    Patient ID: Stephanie BoyerJudy Hernandez, female    DOB: [birth year], 67 y.o.   MRN: [id number]  HPI Patient accompanied by her friend, Stephanie Hernandez, who is a retired Publishing rights managernurse practitioner for discussion of medication management and situational stress.  Patient feels that job stress has become intolerable.  She is going to have to do some additional work over the next week or 2 that she is not comfortable with her familiar with.  This is causing considerable anxiety and stress.  She has been calling Stephanie Hernandez a lot in the evenings afraid because she was started by Stephanie SavageLisa Hernandez on low-dose Celexa.  She thought she was having some side effects from that but I think she is really having anxiety issues.  I encouraged her to stay with medication prescribed.  She may need some FMLA in the near future.  She is considering it.  Also discussion about when she might like to retire.  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  In March we prescribed Xanax 1 mg up to 3 times daily as needed for anxiety.  Paxil has been discontinued and she is now on Celexa.  Review of Systems see above-afraid that something is going to happen to her at night     Objective:   Physical Exam  Not examined but spent 25 minutes speaking with her and Mrs.Hernandez about her situation and what is the best possible course going forward.      Assessment & Plan:  Anxiety state  Situational stress  Panic disorder  Plan: Patient will let me know if we need to complete FMLA form for her.  I have encouraged her to continue current medications.  Reassured her that I do not think she is having a medication reaction to Celexa

## 2018-02-26 ENCOUNTER — Encounter: Payer: Self-pay | Admitting: Internal Medicine

## 2018-02-26 ENCOUNTER — Ambulatory Visit (INDEPENDENT_AMBULATORY_CARE_PROVIDER_SITE_OTHER): Payer: BLUE CROSS/BLUE SHIELD | Admitting: Internal Medicine

## 2018-02-26 VITALS — BP 102/80 | HR 88 | Ht 62.0 in | Wt 172.0 lb

## 2018-02-26 DIAGNOSIS — F439 Reaction to severe stress, unspecified: Secondary | ICD-10-CM

## 2018-02-26 DIAGNOSIS — F410 Panic disorder [episodic paroxysmal anxiety] without agoraphobia: Secondary | ICD-10-CM | POA: Diagnosis not present

## 2018-02-26 DIAGNOSIS — F411 Generalized anxiety disorder: Secondary | ICD-10-CM

## 2018-03-04 NOTE — Progress Notes (Signed)
   Subjective:    Patient ID: Stephanie Hernandez, female    DOB: September 07, 1950, 67 y.o.   MRN: [id number]  HPI Patient returns today for follow-up after being seen on June 14 regarding situational stress at work.  She called June 17 and requested leave of absence.  She is now requesting return to work paperwork be completed.  She continues to see Ellis SavageLisa Poulos, psychologist.  Has been placed on Lamictal.  She is afraid to take it.  We talked about that today.  Lanier Prudeorothy Efird, retired Publishing rights managernurse practitioner and friend, is with her again and is helpful in helping her see the benefit of taking her medications as prescribed.  She has had some anxiety attacks in the evenings.  She has not found another job yet although she is been out of work a few weeks.  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    Review of Systems see above     Objective:   Physical Exam  Spent 25 minutes completing paperwork and speaking with patient at length including coaching regarding returning to work and what she might  expect upon return to work.  She is  encouraged to take her medications as prescribed particularly Xanax and Lamictal.  She is encouraged to continue to see Ellis SavageLisa Poulos.      Assessment & Plan:  Situational stress -patient is returning to work next week and paperwork has been completed  Anxiety-still present  Panic disorder-panic attacks happen in the evenings  Plan: Continue Lamictal and Xanax as prescribed.  Call me with progress report in 1 to 2 weeks.  Dorothy and I explained to her that she is to set limits at work.  She obviously will not be able to complete all tasks on a daily basis and needs to do the best she can with the work in the time allotted.  I do think it is best for her to seek employment that is less stressful.

## 2018-03-04 NOTE — Patient Instructions (Signed)
Return to work paperwork completed.  Patient is to take Lamictal as prescribed and Xanax as needed.

## 2018-03-15 DIAGNOSIS — F331 Major depressive disorder, recurrent, moderate: Secondary | ICD-10-CM | POA: Diagnosis not present

## 2018-03-15 DIAGNOSIS — F411 Generalized anxiety disorder: Secondary | ICD-10-CM | POA: Diagnosis not present

## 2018-04-12 DIAGNOSIS — F3341 Major depressive disorder, recurrent, in partial remission: Secondary | ICD-10-CM | POA: Diagnosis not present

## 2018-04-12 DIAGNOSIS — F411 Generalized anxiety disorder: Secondary | ICD-10-CM | POA: Diagnosis not present

## 2018-05-29 ENCOUNTER — Other Ambulatory Visit: Payer: Self-pay | Admitting: Internal Medicine

## 2018-05-29 DIAGNOSIS — E559 Vitamin D deficiency, unspecified: Secondary | ICD-10-CM

## 2018-05-29 DIAGNOSIS — G4700 Insomnia, unspecified: Secondary | ICD-10-CM

## 2018-05-29 DIAGNOSIS — Z87898 Personal history of other specified conditions: Secondary | ICD-10-CM

## 2018-05-29 DIAGNOSIS — I10 Essential (primary) hypertension: Secondary | ICD-10-CM

## 2018-05-29 DIAGNOSIS — F419 Anxiety disorder, unspecified: Secondary | ICD-10-CM

## 2018-05-29 DIAGNOSIS — Z0000 Encounter for general adult medical examination without abnormal findings: Secondary | ICD-10-CM

## 2018-05-31 ENCOUNTER — Encounter: Payer: Self-pay | Admitting: Internal Medicine

## 2018-05-31 ENCOUNTER — Ambulatory Visit (INDEPENDENT_AMBULATORY_CARE_PROVIDER_SITE_OTHER): Payer: BLUE CROSS/BLUE SHIELD | Admitting: Internal Medicine

## 2018-05-31 VITALS — BP 120/80 | HR 88 | Ht 61.5 in | Wt 182.0 lb

## 2018-05-31 DIAGNOSIS — Z87898 Personal history of other specified conditions: Secondary | ICD-10-CM

## 2018-05-31 DIAGNOSIS — Z23 Encounter for immunization: Secondary | ICD-10-CM | POA: Diagnosis not present

## 2018-05-31 DIAGNOSIS — Z131 Encounter for screening for diabetes mellitus: Secondary | ICD-10-CM

## 2018-05-31 DIAGNOSIS — Z1159 Encounter for screening for other viral diseases: Secondary | ICD-10-CM | POA: Diagnosis not present

## 2018-05-31 DIAGNOSIS — Z0000 Encounter for general adult medical examination without abnormal findings: Secondary | ICD-10-CM

## 2018-05-31 DIAGNOSIS — F439 Reaction to severe stress, unspecified: Secondary | ICD-10-CM

## 2018-05-31 DIAGNOSIS — F419 Anxiety disorder, unspecified: Secondary | ICD-10-CM | POA: Diagnosis not present

## 2018-05-31 DIAGNOSIS — G4700 Insomnia, unspecified: Secondary | ICD-10-CM | POA: Diagnosis not present

## 2018-05-31 DIAGNOSIS — E559 Vitamin D deficiency, unspecified: Secondary | ICD-10-CM | POA: Diagnosis not present

## 2018-05-31 DIAGNOSIS — I10 Essential (primary) hypertension: Secondary | ICD-10-CM | POA: Diagnosis not present

## 2018-05-31 LAB — COMPLETE METABOLIC PANEL WITH GFR
AG Ratio: 2.2 (calc) (ref 1.0–2.5)
ALT: 20 U/L (ref 6–29)
AST: 19 U/L (ref 10–35)
Albumin: 4.6 g/dL (ref 3.6–5.1)
Alkaline phosphatase (APISO): 95 U/L (ref 33–130)
BILIRUBIN TOTAL: 0.8 mg/dL (ref 0.2–1.2)
BUN: 18 mg/dL (ref 7–25)
CHLORIDE: 102 mmol/L (ref 98–110)
CO2: 26 mmol/L (ref 20–32)
Calcium: 10.1 mg/dL (ref 8.6–10.4)
Creat: 0.88 mg/dL (ref 0.50–0.99)
GFR, Est African American: 80 mL/min/{1.73_m2} (ref >=60)
GFR, Est Non African American: 69 mL/min/{1.73_m2} (ref >=60)
GLOBULIN: 2.1 g/dL (ref 1.9–3.7)
Glucose, Bld: 91 mg/dL (ref 65–99)
POTASSIUM: 4.4 mmol/L (ref 3.5–5.3)
SODIUM: 138 mmol/L (ref 135–146)
Total Protein: 6.7 g/dL (ref 6.1–8.1)

## 2018-05-31 LAB — CBC WITH DIFFERENTIAL/PLATELET
BASOS PCT: 0.3 %
Basophils Absolute: 23 cells/uL (ref 0–200)
Eosinophils Absolute: 131 cells/uL (ref 15–500)
Eosinophils Relative: 1.7 %
HCT: 41.9 % (ref 35.0–45.0)
Hemoglobin: 14.2 g/dL (ref 11.7–15.5)
Lymphs Abs: 1694 cells/uL (ref 850–3900)
MCH: 30.3 pg (ref 27.0–33.0)
MCHC: 33.9 g/dL (ref 32.0–36.0)
MCV: 89.3 fL (ref 80.0–100.0)
MONOS PCT: 6.6 %
MPV: 11.9 fL (ref 7.5–12.5)
Neutro Abs: 5344 cells/uL (ref 1500–7800)
Neutrophils Relative %: 69.4 %
PLATELETS: 340 10*3/uL (ref 140–400)
RBC: 4.69 10*6/uL (ref 3.80–5.10)
RDW: 12.4 % (ref 11.0–15.0)
TOTAL LYMPHOCYTE: 22 %
WBC mixed population: 508 cells/uL (ref 200–950)
WBC: 7.7 10*3/uL (ref 3.8–10.8)

## 2018-05-31 LAB — POCT URINALYSIS DIPSTICK
Appearance: NORMAL
Bilirubin, UA: NEGATIVE
Blood, UA: NEGATIVE
Glucose, UA: NEGATIVE
KETONES UA: NEGATIVE
LEUKOCYTES UA: NEGATIVE
NITRITE UA: NEGATIVE
Odor: NORMAL
PH UA: 7 (ref 5.0–8.0)
Protein, UA: NEGATIVE
Spec Grav, UA: 1.01 (ref 1.010–1.025)
Urobilinogen, UA: 0.2 E.U./dL

## 2018-05-31 LAB — LIPID PANEL
CHOL/HDL RATIO: 3.3 (calc) (ref <5.0)
CHOLESTEROL: 181 mg/dL (ref <200)
HDL: 55 mg/dL (ref >50)
LDL CHOLESTEROL (CALC): 103 mg/dL — AB
Non-HDL Cholesterol (Calc): 126 mg/dL (calc) (ref <130)
Triglycerides: 130 mg/dL (ref <150)

## 2018-05-31 LAB — TSH: TSH: 1.68 mIU/L (ref 0.40–4.50)

## 2018-05-31 NOTE — Progress Notes (Signed)
Subjective:    Patient ID: Stephanie Hernandez, female    DOB: 02-Aug-1951, 67 y.o.   MRN: [id number]  HPI  67 year old Female for health maintenance exam and evaluation of medical issues. Will not fully be on Medicare until the Spring. Will get flu vaccine at work. Prevnar 13 given today.New Rx for Shingrix provided.  Situational stress at work has improved.  Seems to be doing better with her work situation.  Tries to do as much as she can and not obsess about what is not being done.  Continues to see Ocie Bob for counseling and medication.  Developed herpes zoster in 2018.  Treated with Valtrex.  Says this was her second episode.  Had another episode a number of years ago.  Shingrix vaccine recommended.  Had colonoscopy in [facility] August 2018.  No lesions noted in 10-year follow-up recommended.  History of palpitations improved after starting SSRI  Encourage diet and exercise.  Seems calmer.  Past medical history: Fractured fifth metatarsal 2004.  Previous colonoscopy 2008 in [place] was normal.  Became menopausal around age 76.  Had tetanus immunization April 2012.  No history of operations except for benign left breast biopsy December 2015.  Social History: Single, never married.  Completed 1 year of college.  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  His Diplomatic Services operational officer for [employer].  Does not smoke or consume alcohol.  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  No children.  Resides alone.  Family history: She stayed at home and took care of her elderly mother.  Mother died at age 72 of respiratory failure.  Father died at age 72 suddenly.  Maternal uncle with history of colon cancer in his 51s.  Maternal grandmother with history of uterine cancer.    Review of Systems  Constitutional: Negative.   All other systems reviewed and are negative.      Objective:   Physical Exam  Constitutional: She is oriented to person, place, and time. She appears  well-developed and well-nourished. No distress.  HENT:  Head: Normocephalic and atraumatic.  Right Ear: External ear normal.  Left Ear: External ear normal.  Mouth/Throat: Oropharynx is clear and moist. No oropharyngeal exudate.  Eyes: Pupils are equal, round, and reactive to light. Conjunctivae and EOM are normal. Right eye exhibits no discharge. Left eye exhibits no discharge. No scleral icterus.  Neck: Neck supple. No JVD present. No thyromegaly present.  Cardiovascular: Normal rate, regular rhythm, normal heart sounds and intact distal pulses.  No murmur heard. Pulmonary/Chest: Effort normal and breath sounds normal. No stridor. No respiratory distress. She has no wheezes. She has no rales.  Abdominal: Soft. Bowel sounds are normal. She exhibits no distension and no mass. There is no tenderness. There is no rebound and no guarding.  Genitourinary:  Genitourinary Comments: Pap taken 2018.  Bimanual normal.  Musculoskeletal: She exhibits no edema or deformity.  Lymphadenopathy:    She has no cervical adenopathy.  Neurological: She is alert and oriented to person, place, and time. She displays normal reflexes. No cranial nerve deficit or sensory deficit. She exhibits normal muscle tone. Coordination normal.  Skin: Skin is warm and dry. No rash noted. She is not diaphoretic.  Psychiatric: She has a normal mood and affect. Her behavior is normal. Judgment and thought content normal.  Vitals reviewed.  Weight 120/90.          Assessment & Plan:  Normal health maintenance exam  History of anxiety and situational stress improved with medication and counseling  Essential hypertension stable on current regimen  History of palpitations-improved  Insomnia-improved  Plan: Continue current medications and return in 6 months.  Have flu vaccine through employment.  Patient requests screening for work stress hepatitis C.

## 2018-06-01 LAB — HEPATITIS C ANTIBODY
Hepatitis C Ab: NONREACTIVE
SIGNAL TO CUT-OFF: 0.04 (ref <1.00)

## 2018-06-01 LAB — HEMOGLOBIN A1C
EAG (MMOL/L): 6 (calc)
Hgb A1c MFr Bld: 5.4 % of total Hgb (ref <5.7)
MEAN PLASMA GLUCOSE: 108 (calc)

## 2018-06-11 DIAGNOSIS — F3341 Major depressive disorder, recurrent, in partial remission: Secondary | ICD-10-CM | POA: Diagnosis not present

## 2018-06-11 DIAGNOSIS — F411 Generalized anxiety disorder: Secondary | ICD-10-CM | POA: Diagnosis not present

## 2018-06-17 NOTE — Patient Instructions (Signed)
Pneumococcal 13 vaccine given today.  Have Shingrix vaccine at pharmacy and have flu vaccine through employment.  Continue same medications and return in 6 months.

## 2018-07-17 ENCOUNTER — Other Ambulatory Visit: Payer: Self-pay | Admitting: Internal Medicine

## 2018-07-17 DIAGNOSIS — Z1231 Encounter for screening mammogram for malignant neoplasm of breast: Secondary | ICD-10-CM

## 2018-09-03 DIAGNOSIS — R509 Fever, unspecified: Secondary | ICD-10-CM | POA: Diagnosis not present

## 2018-09-03 DIAGNOSIS — J019 Acute sinusitis, unspecified: Secondary | ICD-10-CM | POA: Diagnosis not present

## 2018-09-03 DIAGNOSIS — B349 Viral infection, unspecified: Secondary | ICD-10-CM | POA: Diagnosis not present

## 2018-09-13 ENCOUNTER — Ambulatory Visit: Payer: BLUE CROSS/BLUE SHIELD

## 2018-09-17 ENCOUNTER — Ambulatory Visit: Payer: BLUE CROSS/BLUE SHIELD

## 2018-09-20 DIAGNOSIS — R05 Cough: Secondary | ICD-10-CM | POA: Diagnosis not present

## 2018-09-20 DIAGNOSIS — J209 Acute bronchitis, unspecified: Secondary | ICD-10-CM | POA: Diagnosis not present

## 2018-10-25 ENCOUNTER — Ambulatory Visit
Admission: RE | Admit: 2018-10-25 | Discharge: 2018-10-25 | Disposition: A | Discharge status: Home / Self Care | Payer: BLUE CROSS/BLUE SHIELD | Source: Ambulatory Visit | Attending: Internal Medicine | Admitting: Internal Medicine

## 2018-10-25 DIAGNOSIS — Z1231 Encounter for screening mammogram for malignant neoplasm of breast: Secondary | ICD-10-CM | POA: Diagnosis not present

## 2018-12-06 ENCOUNTER — Ambulatory Visit: Payer: BLUE CROSS/BLUE SHIELD | Admitting: Internal Medicine

## 2018-12-13 ENCOUNTER — Telehealth: Payer: Self-pay | Admitting: Internal Medicine

## 2018-12-13 ENCOUNTER — Ambulatory Visit (INDEPENDENT_AMBULATORY_CARE_PROVIDER_SITE_OTHER): Payer: BLUE CROSS/BLUE SHIELD | Admitting: Internal Medicine

## 2018-12-13 ENCOUNTER — Encounter: Payer: Self-pay | Admitting: Internal Medicine

## 2018-12-13 VITALS — BP 132/80 | HR 68 | Ht 61.5 in | Wt 185.0 lb

## 2018-12-13 DIAGNOSIS — I10 Essential (primary) hypertension: Secondary | ICD-10-CM

## 2018-12-13 DIAGNOSIS — Z87898 Personal history of other specified conditions: Secondary | ICD-10-CM

## 2018-12-13 DIAGNOSIS — F411 Generalized anxiety disorder: Secondary | ICD-10-CM | POA: Diagnosis not present

## 2018-12-13 MED ORDER — PINDOLOL 5 MG PO TABS: ORAL_TABLET | ORAL | 0 refills | Status: DC

## 2018-12-13 MED ORDER — LOSARTAN POTASSIUM 25 MG PO TABS: 25.0000 mg | ORAL_TABLET | Freq: Every day | ORAL | 3 refills | Status: DC

## 2018-12-13 NOTE — Telephone Encounter (Signed)
[address] [phone]  [facility]  pindolol (VISKEN) 5 MG tablet   Kahlani called to say that she had called [facility] like you advised and they do have 100 pills, so she would like for you to call in a prescription for that. Please call and let her know if we can do this.

## 2018-12-13 NOTE — Telephone Encounter (Signed)
OK to do this 

## 2018-12-14 NOTE — Progress Notes (Signed)
   Subjective:    Patient ID: Nabihah Marolda, female    DOB: April 25, 1951, 68 y.o.   MRN: [id number]  HPI 68 year old Female for 53-month recheck.  She is seen today by interactive audio and video telecommunications.  She has consented to this format of visit due to the Coronavirus outbreak.  2 patient identifiers were used and she is identified as Posey Boyer, a patient in this practice.  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  She is wearing a mask while working all day long.  Her anxiety is well controlled on current regimen despite the viral outbreak.  Continues to see psychologist for medication management and follow-up.  History of hypertension.  She has had some issues locating pindolol.  We suggested a local small pharmacy here in [place] and she was able to find it there.  I think it would be better for her not to change medications at this point in time.  She has Xanax to take up to 3 times daily for anxiety.  She is on losartan 25 mg daily and pindolol 5 mg as needed for palpitations.  She is on lamotrigine 25 mg 3 times daily.    Review of Systems no new complaints     Objective:   Physical Exam She reports her blood pressure to be well controlled at the present time.  Sleeping fairly well and not very anxious at work.  Occasional palpitations.       Assessment & Plan:  Essential hypertension-stable on current regimen  History of palpitations-has pindolol to use if needed  Anxiety state-treated with Xanax and lamotrigine  Plan: I think she appears calm and much less anxious than in previous visits.  Seems to be managing well on current medications.  Recommend continuing to monitor blood pressure and follow-up in 6 months or sooner if necessary.

## 2018-12-18 NOTE — Patient Instructions (Signed)
Seen today by virtual visit.  Continues current medications and follow-up in 6 months.

## 2018-12-26 DIAGNOSIS — Z03818 Encounter for observation for suspected exposure to other biological agents ruled out: Secondary | ICD-10-CM | POA: Diagnosis not present

## 2019-01-15 DIAGNOSIS — F411 Generalized anxiety disorder: Secondary | ICD-10-CM | POA: Diagnosis not present

## 2019-01-15 DIAGNOSIS — F330 Major depressive disorder, recurrent, mild: Secondary | ICD-10-CM | POA: Diagnosis not present

## 2019-01-20 DIAGNOSIS — Z03818 Encounter for observation for suspected exposure to other biological agents ruled out: Secondary | ICD-10-CM | POA: Diagnosis not present

## 2019-02-11 DIAGNOSIS — Z03818 Encounter for observation for suspected exposure to other biological agents ruled out: Secondary | ICD-10-CM | POA: Diagnosis not present

## 2019-02-12 DIAGNOSIS — Z03818 Encounter for observation for suspected exposure to other biological agents ruled out: Secondary | ICD-10-CM | POA: Diagnosis not present

## 2019-02-20 ENCOUNTER — Other Ambulatory Visit: Payer: BLUE CROSS/BLUE SHIELD | Admitting: Internal Medicine

## 2019-02-25 DIAGNOSIS — Z03818 Encounter for observation for suspected exposure to other biological agents ruled out: Secondary | ICD-10-CM | POA: Diagnosis not present

## 2019-03-06 DIAGNOSIS — Z03818 Encounter for observation for suspected exposure to other biological agents ruled out: Secondary | ICD-10-CM | POA: Diagnosis not present

## 2019-03-07 ENCOUNTER — Other Ambulatory Visit: Payer: Self-pay

## 2019-03-07 ENCOUNTER — Other Ambulatory Visit: Payer: BC Managed Care – PPO | Admitting: Internal Medicine

## 2019-03-07 DIAGNOSIS — E7800 Pure hypercholesterolemia, unspecified: Secondary | ICD-10-CM

## 2019-03-08 LAB — HEPATIC FUNCTION PANEL
AG Ratio: 1.9 (calc) (ref 1.0–2.5)
ALT: 34 U/L — ABNORMAL HIGH (ref 6–29)
AST: 25 U/L (ref 10–35)
Albumin: 4.3 g/dL (ref 3.6–5.1)
Alkaline phosphatase (APISO): 85 U/L (ref 37–153)
Bilirubin, Direct: 0.1 mg/dL (ref 0.0–0.2)
Globulin: 2.3 g/dL (calc) (ref 1.9–3.7)
Indirect Bilirubin: 0.6 mg/dL (calc) (ref 0.2–1.2)
Total Bilirubin: 0.7 mg/dL (ref 0.2–1.2)
Total Protein: 6.6 g/dL (ref 6.1–8.1)

## 2019-03-08 LAB — HEMOGLOBIN A1C
Hgb A1c MFr Bld: 5.4 % of total Hgb (ref <5.7)
Mean Plasma Glucose: 108 (calc)
eAG (mmol/L): 6 (calc)

## 2019-03-08 LAB — LIPID PANEL
Cholesterol: 175 mg/dL (ref <200)
HDL: 46 mg/dL — ABNORMAL LOW (ref >=50)
LDL Cholesterol (Calc): 103 mg/dL (calc) — ABNORMAL HIGH
Non-HDL Cholesterol (Calc): 129 mg/dL (calc) (ref <130)
Total CHOL/HDL Ratio: 3.8 (calc) (ref <5.0)
Triglycerides: 164 mg/dL — ABNORMAL HIGH (ref <150)

## 2019-03-13 ENCOUNTER — Other Ambulatory Visit: Payer: Self-pay

## 2019-03-13 DIAGNOSIS — Z03818 Encounter for observation for suspected exposure to other biological agents ruled out: Secondary | ICD-10-CM | POA: Diagnosis not present

## 2019-03-13 MED ORDER — PINDOLOL 5 MG PO TABS: ORAL_TABLET | ORAL | 0 refills | Status: DC

## 2019-03-19 DIAGNOSIS — Z03818 Encounter for observation for suspected exposure to other biological agents ruled out: Secondary | ICD-10-CM | POA: Diagnosis not present

## 2019-03-26 DIAGNOSIS — Z03818 Encounter for observation for suspected exposure to other biological agents ruled out: Secondary | ICD-10-CM | POA: Diagnosis not present

## 2019-04-08 DIAGNOSIS — Z1159 Encounter for screening for other viral diseases: Secondary | ICD-10-CM | POA: Diagnosis not present

## 2019-04-10 DIAGNOSIS — F411 Generalized anxiety disorder: Secondary | ICD-10-CM | POA: Diagnosis not present

## 2019-04-10 DIAGNOSIS — F330 Major depressive disorder, recurrent, mild: Secondary | ICD-10-CM | POA: Diagnosis not present

## 2019-04-14 DIAGNOSIS — Z1159 Encounter for screening for other viral diseases: Secondary | ICD-10-CM | POA: Diagnosis not present

## 2019-04-22 DIAGNOSIS — R21 Rash and other nonspecific skin eruption: Secondary | ICD-10-CM | POA: Diagnosis not present

## 2019-04-23 DIAGNOSIS — Z1159 Encounter for screening for other viral diseases: Secondary | ICD-10-CM | POA: Diagnosis not present

## 2019-04-30 DIAGNOSIS — Z1159 Encounter for screening for other viral diseases: Secondary | ICD-10-CM | POA: Diagnosis not present

## 2019-05-01 DIAGNOSIS — R21 Rash and other nonspecific skin eruption: Secondary | ICD-10-CM | POA: Diagnosis not present

## 2019-05-01 DIAGNOSIS — L0100 Impetigo, unspecified: Secondary | ICD-10-CM | POA: Diagnosis not present

## 2019-05-07 DIAGNOSIS — Z1159 Encounter for screening for other viral diseases: Secondary | ICD-10-CM | POA: Diagnosis not present

## 2019-05-14 DIAGNOSIS — Z1159 Encounter for screening for other viral diseases: Secondary | ICD-10-CM | POA: Diagnosis not present

## 2019-05-21 DIAGNOSIS — Z1159 Encounter for screening for other viral diseases: Secondary | ICD-10-CM | POA: Diagnosis not present

## 2019-05-27 DIAGNOSIS — Z1159 Encounter for screening for other viral diseases: Secondary | ICD-10-CM | POA: Diagnosis not present

## 2019-06-03 DIAGNOSIS — Z1159 Encounter for screening for other viral diseases: Secondary | ICD-10-CM | POA: Diagnosis not present

## 2019-06-04 ENCOUNTER — Other Ambulatory Visit: Payer: Self-pay | Admitting: Internal Medicine

## 2019-06-13 ENCOUNTER — Ambulatory Visit (INDEPENDENT_AMBULATORY_CARE_PROVIDER_SITE_OTHER): Payer: BC Managed Care – PPO | Admitting: Internal Medicine

## 2019-06-13 ENCOUNTER — Encounter: Payer: Self-pay | Admitting: Internal Medicine

## 2019-06-13 ENCOUNTER — Other Ambulatory Visit: Payer: Self-pay

## 2019-06-13 VITALS — BP 120/88 | HR 65 | Temp 98.0°F | Ht 61.5 in | Wt 181.0 lb

## 2019-06-13 DIAGNOSIS — F419 Anxiety disorder, unspecified: Secondary | ICD-10-CM | POA: Diagnosis not present

## 2019-06-13 DIAGNOSIS — Z23 Encounter for immunization: Secondary | ICD-10-CM

## 2019-06-13 DIAGNOSIS — R8290 Unspecified abnormal findings in urine: Secondary | ICD-10-CM

## 2019-06-13 DIAGNOSIS — G4700 Insomnia, unspecified: Secondary | ICD-10-CM

## 2019-06-13 DIAGNOSIS — I10 Essential (primary) hypertension: Secondary | ICD-10-CM | POA: Diagnosis not present

## 2019-06-13 DIAGNOSIS — Z0000 Encounter for general adult medical examination without abnormal findings: Secondary | ICD-10-CM | POA: Diagnosis not present

## 2019-06-13 DIAGNOSIS — Z87898 Personal history of other specified conditions: Secondary | ICD-10-CM | POA: Diagnosis not present

## 2019-06-13 DIAGNOSIS — F439 Reaction to severe stress, unspecified: Secondary | ICD-10-CM | POA: Diagnosis not present

## 2019-06-13 LAB — POCT URINALYSIS DIPSTICK
Bilirubin, UA: NEGATIVE
Blood, UA: NEGATIVE
Glucose, UA: NEGATIVE
Ketones, UA: NEGATIVE
Nitrite, UA: NEGATIVE
Protein, UA: NEGATIVE
Spec Grav, UA: 1.01 (ref 1.010–1.025)
Urobilinogen, UA: 0.2 E.U./dL
pH, UA: 6.5 (ref 5.0–8.0)

## 2019-06-13 NOTE — Progress Notes (Signed)
Subjective:    Patient ID: Stephanie Hernandez, female    DOB: 1950/10/10, 68 y.o.   MRN: [id number]  HPI  68 year old Female in today for health maintenance exam and evaluation of medical issues.  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  Situational stress at work has improved.  Seems to be doing better with work situation.  Continues to see Ellis Savage for counseling and medication.  History of anxiety related to work.  She developed Herpes zoster in 2018 treated with Valtrex.  Several weeks ago she encountered a plant near her porch that she thought was benign and had an allergic reaction to with cellulitis requiring antibiotics and steroids.  It affected her foot with a severe contact dermatitis.  She shows me a picture today.  Colonoscopy in [place] August 2018.  No lesions noted and 10-year follow-up recommended.  History of palpitations that improved after starting SSRI.  Past medical history: Fractured fifth metatarsal 2004.  Previous colonoscopy 2008 calcium was normal.  Became menopausal around age 81.  Tetanus immunization April 2012.  No history of operations except for benign left breast biopsy December 2015.  Social history: Single, never married.  Completed 1 year of college.  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  Does not smoke or consume alcohol.  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  No children.  Resides alone.  Family history: Patient stated home and took care of her elderly mother.  Mother really died at age 40 of respiratory failure.  Father died at age 66-week.  Maternal uncle with history of colon cancer in his 30s.  Maternal grandmother with history of uterine cancer.      Review of Systems  Constitutional: Negative.   Respiratory: Negative.   Cardiovascular: Negative.   Gastrointestinal: Negative.   Genitourinary: Negative.   Neurological: Negative.   Psychiatric/Behavioral: Negative.         Objective:   Physical Exam Vitals signs reviewed.  Constitutional:      General: She is not in acute distress.    Appearance: Normal appearance. She is not diaphoretic.  HENT:     Head: Normocephalic and atraumatic.     Right Ear: Tympanic membrane normal.     Left Ear: Tympanic membrane normal.     Nose: Nose normal.     Mouth/Throat:     Mouth: Mucous membranes are moist.     Pharynx: Oropharynx is clear.  Eyes:     General: No scleral icterus.       Right eye: No discharge.        Left eye: No discharge.     Extraocular Movements: Extraocular movements intact.     Conjunctiva/sclera: Conjunctivae normal.     Pupils: Pupils are equal, round, and reactive to light.  Neck:     Musculoskeletal: Neck supple. No neck rigidity.     Vascular: No carotid bruit.  Cardiovascular:     Rate and Rhythm: Normal rate and regular rhythm.     Pulses: Normal pulses.     Heart sounds: No murmur.  Pulmonary:     Effort: Pulmonary effort is normal. No respiratory distress.     Breath sounds: Normal breath sounds. No rales.  Abdominal:     General: Bowel sounds are normal. There is no distension.     Tenderness: There is no abdominal tenderness. There is no guarding.  Genitourinary:  Comments: Pap taken in 2018.  Bimanual normal. Musculoskeletal:     Right lower leg: No edema.     Left lower leg: No edema.  Lymphadenopathy:     Cervical: No cervical adenopathy.  Skin:    General: Skin is warm and dry.     Findings: No rash.     Comments: Resolving postinflammatory erythema dorsum of foot from severe contact dermatitis with infection  Neurological:     General: No focal deficit present.     Mental Status: She is alert and oriented to person, place, and time.     Cranial Nerves: No cranial nerve deficit.     Coordination: Coordination normal.  Psychiatric:        Mood and Affect: Mood normal.        Behavior: Behavior normal.        Thought Content: Thought content normal.         Judgment: Judgment normal.    BP 120/88 pulse 65, Temp 98, BMI 33.65       Assessment & Plan:  History of anxiety and situational stress improved with medication and counseling  Essential hypertension-stable on current regimen  Palpitations-improved  Insomnia improved  Status post severe contact dermatitis with cellulitis secondary to plant exposure with resolving postinflammatory hyperpigmentation of dorsum of foot  Plan: Continue current medications and follow-up in late April.  Pneumococcal 23 vaccine given.  Her urine dipstick had trace LE.  Culture was negative.  Her serum potassium was 5.5 likely hemolyzed due to lab error and will be repeated  Addendum: June 20 2019:  repeat serum potassium 4.8 and normal.  I feel that elevated serum potassium on October 23 was due to lab error

## 2019-06-14 LAB — COMPLETE METABOLIC PANEL WITH GFR
AG Ratio: 1.8 (calc) (ref 1.0–2.5)
ALT: 24 U/L (ref 6–29)
AST: 21 U/L (ref 10–35)
Albumin: 4.5 g/dL (ref 3.6–5.1)
Alkaline phosphatase (APISO): 85 U/L (ref 37–153)
BUN: 22 mg/dL (ref 7–25)
CO2: 27 mmol/L (ref 20–32)
Calcium: 10 mg/dL (ref 8.6–10.4)
Chloride: 103 mmol/L (ref 98–110)
Creat: 0.96 mg/dL (ref 0.50–0.99)
GFR, Est African American: 71 mL/min/{1.73_m2} (ref >=60)
GFR, Est Non African American: 62 mL/min/{1.73_m2} (ref >=60)
Globulin: 2.5 g/dL (calc) (ref 1.9–3.7)
Glucose, Bld: 102 mg/dL — ABNORMAL HIGH (ref 65–99)
Potassium: 5.5 mmol/L — ABNORMAL HIGH (ref 3.5–5.3)
Sodium: 140 mmol/L (ref 135–146)
Total Bilirubin: 0.9 mg/dL (ref 0.2–1.2)
Total Protein: 7 g/dL (ref 6.1–8.1)

## 2019-06-14 LAB — CBC WITH DIFFERENTIAL/PLATELET
Absolute Monocytes: 489 cells/uL (ref 200–950)
Basophils Absolute: 7 cells/uL (ref 0–200)
Basophils Relative: 0.1 %
Eosinophils Absolute: 117 cells/uL (ref 15–500)
Eosinophils Relative: 1.6 %
HCT: 41.1 % (ref 35.0–45.0)
Hemoglobin: 13.7 g/dL (ref 11.7–15.5)
Lymphs Abs: 1913 cells/uL (ref 850–3900)
MCH: 30.4 pg (ref 27.0–33.0)
MCHC: 33.3 g/dL (ref 32.0–36.0)
MCV: 91.3 fL (ref 80.0–100.0)
MPV: 12.1 fL (ref 7.5–12.5)
Monocytes Relative: 6.7 %
Neutro Abs: 4774 cells/uL (ref 1500–7800)
Neutrophils Relative %: 65.4 %
Platelets: 288 10*3/uL (ref 140–400)
RBC: 4.5 10*6/uL (ref 3.80–5.10)
RDW: 12.4 % (ref 11.0–15.0)
Total Lymphocyte: 26.2 %
WBC: 7.3 10*3/uL (ref 3.8–10.8)

## 2019-06-14 LAB — URINE CULTURE
MICRO NUMBER:: 1023623
Result:: NO GROWTH
SPECIMEN QUALITY:: ADEQUATE

## 2019-06-14 LAB — HEMOGLOBIN A1C
Hgb A1c MFr Bld: 5.4 % of total Hgb (ref <5.7)
Mean Plasma Glucose: 108 (calc)
eAG (mmol/L): 6 (calc)

## 2019-06-14 LAB — LIPID PANEL
Cholesterol: 191 mg/dL (ref <200)
HDL: 50 mg/dL (ref >=50)
LDL Cholesterol (Calc): 115 mg/dL (calc) — ABNORMAL HIGH
Non-HDL Cholesterol (Calc): 141 mg/dL (calc) — ABNORMAL HIGH (ref <130)
Total CHOL/HDL Ratio: 3.8 (calc) (ref <5.0)
Triglycerides: 147 mg/dL (ref <150)

## 2019-06-14 LAB — TSH: TSH: 2.05 mIU/L (ref 0.40–4.50)

## 2019-06-14 LAB — VITAMIN D 25 HYDROXY (VIT D DEFICIENCY, FRACTURES): Vit D, 25-Hydroxy: 31 ng/mL (ref 30–100)

## 2019-06-17 DIAGNOSIS — Z1159 Encounter for screening for other viral diseases: Secondary | ICD-10-CM | POA: Diagnosis not present

## 2019-06-19 ENCOUNTER — Other Ambulatory Visit: Payer: BC Managed Care – PPO | Admitting: Internal Medicine

## 2019-06-20 ENCOUNTER — Other Ambulatory Visit: Payer: BC Managed Care – PPO | Admitting: Internal Medicine

## 2019-06-20 ENCOUNTER — Other Ambulatory Visit: Payer: Self-pay

## 2019-06-20 DIAGNOSIS — E875 Hyperkalemia: Secondary | ICD-10-CM

## 2019-06-20 NOTE — Addendum Note (Signed)
Addended by: Mady Haagensen on: 06/20/2019 09:09 AM   Modules accepted: Orders

## 2019-06-21 LAB — POTASSIUM: Potassium: 4.8 mmol/L (ref 3.5–5.3)

## 2019-06-21 NOTE — Patient Instructions (Signed)
It was a pleasure to see you today.  Continue current medications and follow-up in late April.

## 2019-07-01 DIAGNOSIS — Z1159 Encounter for screening for other viral diseases: Secondary | ICD-10-CM | POA: Diagnosis not present

## 2019-07-07 DIAGNOSIS — Z1159 Encounter for screening for other viral diseases: Secondary | ICD-10-CM | POA: Diagnosis not present

## 2019-07-10 DIAGNOSIS — Z1159 Encounter for screening for other viral diseases: Secondary | ICD-10-CM | POA: Diagnosis not present

## 2019-07-14 ENCOUNTER — Other Ambulatory Visit: Payer: Self-pay | Admitting: Internal Medicine

## 2019-07-14 DIAGNOSIS — Z1159 Encounter for screening for other viral diseases: Secondary | ICD-10-CM | POA: Diagnosis not present

## 2019-09-16 ENCOUNTER — Telehealth: Payer: Self-pay | Admitting: Internal Medicine

## 2019-09-16 NOTE — Telephone Encounter (Signed)
Patient called about recent Covid-19 injection site which has become red and a little itchy. This would seem to be a local reaction and should subside within a few days. Ok to take Benadryl or use topical hyprocortisone cream.

## 2019-09-17 DIAGNOSIS — F411 Generalized anxiety disorder: Secondary | ICD-10-CM | POA: Diagnosis not present

## 2019-09-17 DIAGNOSIS — F331 Major depressive disorder, recurrent, moderate: Secondary | ICD-10-CM | POA: Diagnosis not present

## 2019-10-18 ENCOUNTER — Other Ambulatory Visit: Payer: Self-pay | Admitting: Internal Medicine

## 2019-10-27 DIAGNOSIS — F411 Generalized anxiety disorder: Secondary | ICD-10-CM | POA: Diagnosis not present

## 2019-10-27 DIAGNOSIS — F331 Major depressive disorder, recurrent, moderate: Secondary | ICD-10-CM | POA: Diagnosis not present

## 2019-12-19 ENCOUNTER — Ambulatory Visit: Payer: BC Managed Care – PPO | Admitting: Internal Medicine

## 2019-12-25 ENCOUNTER — Encounter: Payer: Self-pay | Admitting: Internal Medicine

## 2019-12-25 ENCOUNTER — Ambulatory Visit (INDEPENDENT_AMBULATORY_CARE_PROVIDER_SITE_OTHER): Payer: Medicare Other | Admitting: Internal Medicine

## 2019-12-25 ENCOUNTER — Other Ambulatory Visit: Payer: Self-pay

## 2019-12-25 ENCOUNTER — Ambulatory Visit
Admission: RE | Admit: 2019-12-25 | Discharge: 2019-12-25 | Disposition: A | Discharge status: Home / Self Care | Payer: Medicare Other | Source: Ambulatory Visit | Attending: Internal Medicine | Admitting: Internal Medicine

## 2019-12-25 VITALS — BP 130/80 | HR 73 | Temp 98.3°F | Ht 61.5 in | Wt 176.0 lb

## 2019-12-25 DIAGNOSIS — M25561 Pain in right knee: Secondary | ICD-10-CM

## 2019-12-25 DIAGNOSIS — R7309 Other abnormal glucose: Secondary | ICD-10-CM | POA: Diagnosis not present

## 2019-12-25 DIAGNOSIS — E7800 Pure hypercholesterolemia, unspecified: Secondary | ICD-10-CM | POA: Diagnosis not present

## 2019-12-25 DIAGNOSIS — Z87898 Personal history of other specified conditions: Secondary | ICD-10-CM | POA: Diagnosis not present

## 2019-12-25 MED ORDER — MELOXICAM 15 MG PO TABS: 15.0000 mg | ORAL_TABLET | Freq: Every day | ORAL | 0 refills | Status: DC

## 2019-12-25 NOTE — Progress Notes (Signed)
   Subjective:    Patient ID: Stephanie Hernandez, female    DOB: December 06, 1950, 69 y.o.   MRN: [id number]  HPI Patient here for 6 month recheck.Has been at patient here since 2012. 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 Has noticed some right knee pain that started recently, medial aspect. Is on her feet much more with this job than previous job.   Also, recently had a fall at work that happened after the knee pain started, as I understand it. May have slipped on wet spot on floor. She did report this incident to work Education officer, environmental. Does not feel she can serve beverages with the right knee pain being persistent at this time so I gave her a note today to be excused from that duty. Have ordered right knee Xray. Will make appointment for her to see Orthopedist. Starting her on Mobic 15 mg daily. Ibuprofen OTC has not helped.  Patient has no known joint issues previously to my knowledge. History of HTN, palpitations, insomnia, anxiety and depression. Sees Ellis Savage for counseling and takes Lamictal, Wellbutrin, and Alprazolam.  Single, never married, resides alone. Parents left home place to her in [facility] [place]. Does not smoke or consume alcohol.  Hemoglobin AIC and lipid panel drawn today for 6 month follow up.Hgb AIC excellent at 5.1% and was 5.4% when checked  6 months ago. Total Cholesterol 191,HDL 53, Triglycerides 123 and LDL cholesterol 115. Triglycerides improved from 6 months ago. Hx of very mild elevation of fasting serum glucose.  Hx fractured fifth metatarsal 2004. No  surgeries except for benign left breast biopsy 2015.  Review of Systems History of anxiety and HTN stable with medication     Objective:   Physical Exam  BP 130/80 pulse 73 T 98.3 degrees Pulse ox 98% Weight 176 pounds Skin warm and dry.   No carotid bruits.  No cervical adenopathy.  Chest clear to auscultation.  Cardiac exam regular rate and rhythm normal S1 and S2 without murmurs or gallops.  No lower extremity pitting edema.  Right knee: No effusion but has palpable tenderness medial joint line.  No significant tenderness of the medial collateral ligament.  Good range of motion.      Assessment & Plan:  Medial right knee pain-started after new job where she is on her feet more.  Trial of Mobic 15 mg daily.  Apply heat or ice to knee daily.  We will see orthopedist in the near future.  Note given to be off her feet until seen by orthopedist.  Essential hypertension-stable on current regimen  Anxiety is stable-continue to see Ellis Savage for counseling.  No change in medications.  Continue lamotrigine and Wellbutrin.  Also has Xanax for anxiety.  History of elevated serum glucose-hemoglobin A1c is normal at 5.1%  Elevated LDL 115- does not want to be on statin therapy. Continue diet and exercise regimen  Palpitations- stable on current regimen  Plan: Orthopedic consult for knee pain. Xray of right knee odered and is WNL. Meloxicam 15 mg daily. Ice or heat to right knee.    Regarding chronic medical conditions, continue same meds and RTC in 6 months for health maintenance exam and labs

## 2019-12-26 LAB — LIPID PANEL
Cholesterol: 191 mg/dL (ref <200)
HDL: 53 mg/dL (ref >=50)
LDL Cholesterol (Calc): 115 mg/dL (calc) — ABNORMAL HIGH
Non-HDL Cholesterol (Calc): 138 mg/dL (calc) — ABNORMAL HIGH (ref <130)
Total CHOL/HDL Ratio: 3.6 (calc) (ref <5.0)
Triglycerides: 123 mg/dL (ref <150)

## 2019-12-26 LAB — HEMOGLOBIN A1C
Hgb A1c MFr Bld: 5.1 % of total Hgb (ref <5.7)
Mean Plasma Glucose: 100 (calc)
eAG (mmol/L): 5.5 (calc)

## 2019-12-26 NOTE — Patient Instructions (Signed)
Continue current medications for chronic medical conditions.  Follow-up in 6 months at time of health maintenance exam and Medicare annual wellness visit.  Orthopedic consultation for right knee pain which is new.  X-ray ordered of right knee.  May apply ice or heat to the right knee several times daily.  Meloxicam 15 mg daily.  Note given to be off feet until seen by orthopedist for evaluation.

## 2020-01-05 ENCOUNTER — Other Ambulatory Visit: Payer: Self-pay

## 2020-01-05 ENCOUNTER — Ambulatory Visit: Payer: Medicare Other | Admitting: Orthopaedic Surgery

## 2020-01-05 DIAGNOSIS — M25561 Pain in right knee: Secondary | ICD-10-CM | POA: Diagnosis not present

## 2020-01-05 MED ORDER — MELOXICAM 15 MG PO TABS: 15.0000 mg | ORAL_TABLET | Freq: Every day | ORAL | 3 refills | Status: DC | PRN

## 2020-01-05 NOTE — Progress Notes (Signed)
Office Visit Note   Patient: Stephanie Hernandez           Date of Birth: [birth year]           MRN: [id number] Visit Date: 01/05/2020              Requested by: Elby Showers, MD [address] [place],  [place] [id number] PCP: Elby Showers, MD   Assessment & Plan: Visit Diagnoses:  1. Acute pain of right knee     Plan: I showed her knee model and her x-rays and at this point recommend at least trying Voltaren gel in the medial aspect of her right knee 2-3 times a day.  She can continue the Mobic as well.  Also recommended a steroid injection into her knee but she is defer this to see how the other medications help.  If she does not have any significant improvement she knows to give [place] a call because my next step would be recommending an intra-articular steroid injection into the right knee.  Follow-Up Instructions: Return if symptoms worsen or fail to improve.   Orders:  No orders of the defined types were placed in this encounter.  No orders of the defined types were placed in this encounter.     Procedures: No procedures performed   Clinical Data: No additional findings.   Subjective: Chief Complaint  Patient presents with  . Right Knee - Pain  The patient is a very pleasant 69 year old female referred from her primary care physician Dr. Jossie Ng Baxley to evaluate right knee pain.  She is never injured this knee before but is been hurting her for several months and she points to the medial joint line as a source of her pain.  She is to sit in her recliner chair with her right knee bent underneath her and that is when it started to hurt and feel stiff.  She stopped sitting that way and it got a little bit better.  She is retired but then took on a part-time job where she stands quite a bit.  She is wondering if that affects her knee.  She denies any swelling or locking and catching and she points the medial joint line as the source of her pain with her right knee.  She has  been on meloxicam and that is helped some.  She is never had surgery for that knee.  She denies any other acute changes in her medical status.  She is not a diabetic and not a smoker.  HPI  Review of Systems She currently denies any headache, chest pain, shortness of breath, fever, chills, nausea, vomiting  Objective: Vital Signs: There were no vitals taken for this visit.  Physical Exam She is alert and orient x3 and in no acute distress Ortho Exam Examination of her right knee shows medial joint line tenderness but a negative McMurray's exam.  Her Lachman's exam is negative.  She has excellent range of motion of that knee.  There is a little bit of pain when I rotate the tibia on the femur internally at the medial joint line. Specialty Comments:  No specialty comments available.  Imaging: No results found. X-rays on the canopy system of her right knee show no acute findings.  There is only slight medial joint space narrowing and slight patellofemoral narrowing.  PMFS History: Patient Active Problem List   Diagnosis Date Noted  . Insomnia 09/19/2015  . Vitamin D deficiency 05/15/2015  . Palpitations 02/16/2013  .  Depression 02/16/2013  . Hypertension 01/20/2012  . Anxiety 01/20/2012   Past Medical History:  Diagnosis Date  . Hypertension     Family History  Problem Relation Age of Onset  . Heart disease Mother   . Heart disease Father     No past surgical history on file. Social History   Occupational History  . Not on file  Tobacco Use  . Smoking status: Never Smoker  . Smokeless tobacco: Never Used  Substance and Sexual Activity  . Alcohol use: No  . Drug use: No  . Sexual activity: Not on file

## 2020-01-13 ENCOUNTER — Other Ambulatory Visit: Payer: Self-pay | Admitting: Internal Medicine

## 2020-01-27 ENCOUNTER — Other Ambulatory Visit: Payer: Self-pay | Admitting: Internal Medicine

## 2020-01-27 DIAGNOSIS — Z1231 Encounter for screening mammogram for malignant neoplasm of breast: Secondary | ICD-10-CM

## 2020-02-17 ENCOUNTER — Other Ambulatory Visit: Payer: Self-pay

## 2020-02-17 ENCOUNTER — Ambulatory Visit: Payer: Medicare Other

## 2020-02-17 ENCOUNTER — Ambulatory Visit
Admission: RE | Admit: 2020-02-17 | Discharge: 2020-02-17 | Disposition: A | Discharge status: Home / Self Care | Payer: Medicare Other | Source: Ambulatory Visit | Attending: Internal Medicine | Admitting: Internal Medicine

## 2020-02-17 DIAGNOSIS — Z1231 Encounter for screening mammogram for malignant neoplasm of breast: Secondary | ICD-10-CM

## 2020-06-12 ENCOUNTER — Other Ambulatory Visit: Payer: Self-pay | Admitting: Orthopaedic Surgery

## 2020-06-15 ENCOUNTER — Ambulatory Visit (INDEPENDENT_AMBULATORY_CARE_PROVIDER_SITE_OTHER): Payer: Medicare Other | Admitting: Internal Medicine

## 2020-06-15 ENCOUNTER — Encounter: Payer: Self-pay | Admitting: Internal Medicine

## 2020-06-15 ENCOUNTER — Other Ambulatory Visit: Payer: Self-pay

## 2020-06-15 VITALS — BP 120/80 | HR 73 | Ht 61.25 in | Wt 181.0 lb

## 2020-06-15 DIAGNOSIS — Z0000 Encounter for general adult medical examination without abnormal findings: Secondary | ICD-10-CM | POA: Diagnosis not present

## 2020-06-15 DIAGNOSIS — G4700 Insomnia, unspecified: Secondary | ICD-10-CM

## 2020-06-15 DIAGNOSIS — I10 Essential (primary) hypertension: Secondary | ICD-10-CM

## 2020-06-15 DIAGNOSIS — Z87898 Personal history of other specified conditions: Secondary | ICD-10-CM

## 2020-06-15 DIAGNOSIS — F411 Generalized anxiety disorder: Secondary | ICD-10-CM

## 2020-06-15 LAB — POCT URINALYSIS DIPSTICK
Appearance: NEGATIVE
Bilirubin, UA: NEGATIVE
Blood, UA: NEGATIVE
Glucose, UA: NEGATIVE
Ketones, UA: NEGATIVE
Leukocytes, UA: NEGATIVE
Nitrite, UA: NEGATIVE
Odor: NEGATIVE
Protein, UA: NEGATIVE
Spec Grav, UA: 1.015 (ref 1.010–1.025)
Urobilinogen, UA: 0.2 E.U./dL
pH, UA: 6 (ref 5.0–8.0)

## 2020-06-15 NOTE — Patient Instructions (Addendum)
Have third Covid-19 immunization soon.  Other immunizations are up to date.  RTC in 6 months. Labs drawn and pending. It was a pleasure to see you today.

## 2020-06-15 NOTE — Progress Notes (Signed)
Subjective:    Patient ID: Stephanie Hernandez, female    DOB: Oct 18, 1950, 69 y.o.   MRN: [id number]  HPI  69 year old Female for Welcome to Medicare physical exam and evaluation of medical issues.  Continues to see Ellis Savage for counseling and medication management for anxiety/depression issues.  History of palpitations.  Had colonoscopy in [place] August 2019 with 10-year follow-up recommended.  Herpes zoster 2018 treated with Valtrex.  Severe contact dermatitis with cellulitis requiring antibiotics and steroids in 2020.  Past medical history: Fractured fifth metatarsal 2004.  Became menopausal around age 67.  Tetanus immunization April 2012.  No history of operations except for benign left breast biopsy December 2015.  Social history: Single, never married.  Completed 1 year of college.  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  Does not smoke or consume alcohol.  No children.  Resides alone.  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  Family history: Mother died at age 58 of respiratory failure.  Father died at age 97 suddenly.  Maternal uncle with history of colon cancer in his 34s.  Maternal grandmother with history of uterine cancer.      Review of Systems  Constitutional: Negative.   Eyes: Negative.   Respiratory: Negative.   Cardiovascular: Negative.   Gastrointestinal: Negative.   Genitourinary: Negative.   Neurological: Negative.   Psychiatric/Behavioral: Positive for dysphoric mood.       Objective:   Physical Exam Vitals reviewed.  Constitutional:      General: She is not in acute distress.    Appearance: Normal appearance.  HENT:     Head: Normocephalic.     Right Ear: Tympanic membrane normal.     Left Ear: Tympanic membrane normal.     Nose: Nose normal.  Eyes:     General: No scleral icterus.       Right eye: No discharge.        Left eye: No discharge.     Extraocular Movements: Extraocular movements  intact.     Conjunctiva/sclera: Conjunctivae normal.     Pupils: Pupils are equal, round, and reactive to light.  Neck:     Vascular: No carotid bruit.     Comments: No thyromegaly Cardiovascular:     Rate and Rhythm: Normal rate and regular rhythm.     Heart sounds: Normal heart sounds. No murmur heard.      Comments: No carotid bruits Pulmonary:     Effort: Pulmonary effort is normal. No respiratory distress.     Breath sounds: Normal breath sounds. No wheezing or rales.     Comments: Breasts without masses Abdominal:     General: Bowel sounds are normal.     Palpations: Abdomen is soft. There is no mass.     Tenderness: There is no abdominal tenderness.  Genitourinary:    Comments: Bimanual is normal.  Pap deferred due to age. Musculoskeletal:     Cervical back: Neck supple. No rigidity.     Right lower leg: No edema.     Left lower leg: No edema.  Lymphadenopathy:     Cervical: No cervical adenopathy.  Skin:    General: Skin is warm and dry.     Findings: No rash.  Neurological:     General: No focal deficit present.     Mental Status: She is alert and oriented to person, place, and time.     Cranial Nerves: No cranial nerve  deficit.     Motor: No weakness.     Coordination: Coordination normal.     Gait: Gait normal.  Psychiatric:        Behavior: Behavior normal.        Thought Content: Thought content normal.        Judgment: Judgment normal.           Assessment & Plan:  Normal health maintenance exam.  Fasting labs drawn and are pending.  Received flu vaccine on October season.  Has had pneumococcal and Prevnar 13 vaccines.  Has had 2 COVID-19 vaccines and tetanus immunization is still up-to-date.  Colonoscopy is up-to-date.  Has a annual mammogram.  History of anxiety-seen by Ellis Savage and has appointment today for follow-up.  Essential hypertension-stable on current regimen  History of palpitations-improved on current regimen  History of  insomnia  Plan: She will continue with current medications and follow-up in 6 months.  Labs are drawn and are pending and we will notify her by telephone with regard to results.  Subjective:   Patient presents for Medicare Annual/Subsequent preventive examination.  Review Past Medical/Family/Social:see above   Risk Factors  Current exercise habits: light exercise Dietary issues discussed: low fat low carb  Cardiac risk factors:elevated LDL  Depression Screen  (Note: if answer to either of the following is "Yes", a more complete depression screening is indicated)   Over the past two weeks, have you felt down, depressed or hopeless? No  Over the past two weeks, have you felt little interest or pleasure in doing things? No Have you lost interest or pleasure in daily life? No Do you often feel hopeless? No Do you cry easily over simple problems? No   Activities of Daily Living  In your present state of health, do you have any difficulty performing the following activities?:   Driving? No  Managing money? No  Feeding yourself? No  Getting from bed to chair? No  Climbing a flight of stairs? No  Preparing food and eating?: No  Bathing or showering? No  Getting dressed: No  Getting to the toilet? No  Using the toilet:No  Moving around from place to place: No  In the past year have you fallen or had a near fall?:No  Are you sexually active? No  Do you have more than one partner? No   Hearing Difficulties: No  Do you often ask people to speak up or repeat themselves? No  Do you experience ringing or noises in your ears? No  Do you have difficulty understanding soft or whispered voices? No  Do you feel that you have a problem with memory? No Do you often misplace items? No    Home Safety:  Do you have a smoke alarm at your residence? Yes Do you have grab bars in the bathroom? yes Do you have throw rugs in your house?  No   Cognitive Testing  Alert? Yes Normal  Appearance?Yes  Oriented to person? Yes Place? Yes  Time? Yes  Recall of three objects? Yes  Can perform simple calculations? Yes  Displays appropriate judgment?Yes  Can read the correct time from a watch face?Yes   List the Names of Other Physician/Practitioners you currently use:  See referral list for the physicians patient is currently seeing.  Ellis Savage for counseling and medication management   Review of Systems: See above   Objective:     General appearance: Appears younger than stated age Head: Normocephalic, without obvious abnormality, atraumatic  Eyes: conj clear, EOMi PEERLA  Ears: normal TM's and external ear canals both ears  Nose: Nares normal. Septum midline. Mucosa normal. No drainage or sinus tenderness.  Throat: lips, mucosa, and tongue normal; teeth and gums normal  Neck: no adenopathy, no carotid bruit, no JVD, supple, symmetrical, trachea midline and thyroid not enlarged, symmetric, no tenderness/mass/nodules  No CVA tenderness.  Lungs: clear to auscultation bilaterally  Breasts: normal appearance, no masses or tenderness Heart: regular rate and rhythm, S1, S2 normal, no murmur, click, rub or gallop  Abdomen: soft, non-tender; bowel sounds normal; no masses, no organomegaly  Musculoskeletal: ROM normal in all joints, no crepitus, no deformity, Normal muscle strengthen. Back  is symmetric, no curvature. Skin: Skin color, texture, turgor normal. No rashes or lesions  Lymph nodes: Cervical, supraclavicular, and axillary nodes normal.  Neurologic: CN 2 -12 Normal, Normal symmetric reflexes. Normal coordination and gait  Psych: Alert & Oriented x 3, Mood appear stable.    Assessment:    Annual wellness medicare exam   Plan:    During the course of the visit the patient was educated and counseled about appropriate screening and preventive services including:   Annual flu vaccine  Have COVID-19 vaccine booster when available     Patient  Instructions (the written plan) was given to the patient.  Medicare Attestation  I have personally reviewed:  The patient's medical and social history  Their use of alcohol, tobacco or illicit drugs  Their current medications and supplements  The patient's functional ability including ADLs,fall risks, home safety risks, cognitive, and hearing and visual impairment  Diet and physical activities  Evidence for depression or mood disorders  The patient's weight, height, BMI, and visual acuity have been recorded in the chart. I have made referrals, counseling, and provided education to the patient based on review of the above and I have provided the patient with a written personalized care plan for preventive services.

## 2020-06-16 LAB — CBC WITH DIFFERENTIAL/PLATELET
Absolute Monocytes: 468 cells/uL (ref 200–950)
Basophils Absolute: 8 cells/uL (ref 0–200)
Basophils Relative: 0.1 %
Eosinophils Absolute: 117 cells/uL (ref 15–500)
Eosinophils Relative: 1.5 %
HCT: 44.7 % (ref 35.0–45.0)
Hemoglobin: 15.2 g/dL (ref 11.7–15.5)
Lymphs Abs: 1420 cells/uL (ref 850–3900)
MCH: 31.4 pg (ref 27.0–33.0)
MCHC: 34 g/dL (ref 32.0–36.0)
MCV: 92.4 fL (ref 80.0–100.0)
MPV: 12.1 fL (ref 7.5–12.5)
Monocytes Relative: 6 %
Neutro Abs: 5788 cells/uL (ref 1500–7800)
Neutrophils Relative %: 74.2 %
Platelets: 326 10*3/uL (ref 140–400)
RBC: 4.84 10*6/uL (ref 3.80–5.10)
RDW: 12.5 % (ref 11.0–15.0)
Total Lymphocyte: 18.2 %
WBC: 7.8 10*3/uL (ref 3.8–10.8)

## 2020-06-16 LAB — HEMOGLOBIN A1C
Hgb A1c MFr Bld: 5.3 % of total Hgb (ref <5.7)
Mean Plasma Glucose: 105 (calc)
eAG (mmol/L): 5.8 (calc)

## 2020-06-16 LAB — LIPID PANEL
Cholesterol: 213 mg/dL — ABNORMAL HIGH (ref <200)
HDL: 58 mg/dL (ref >=50)
LDL Cholesterol (Calc): 125 mg/dL (calc) — ABNORMAL HIGH
Non-HDL Cholesterol (Calc): 155 mg/dL (calc) — ABNORMAL HIGH (ref <130)
Total CHOL/HDL Ratio: 3.7 (calc) (ref <5.0)
Triglycerides: 179 mg/dL — ABNORMAL HIGH (ref <150)

## 2020-06-16 LAB — COMPLETE METABOLIC PANEL WITH GFR
AG Ratio: 2 (calc) (ref 1.0–2.5)
ALT: 19 U/L (ref 6–29)
AST: 19 U/L (ref 10–35)
Albumin: 4.8 g/dL (ref 3.6–5.1)
Alkaline phosphatase (APISO): 94 U/L (ref 37–153)
BUN/Creatinine Ratio: 14 (calc) (ref 6–22)
BUN: 15 mg/dL (ref 7–25)
CO2: 27 mmol/L (ref 20–32)
Calcium: 10.4 mg/dL (ref 8.6–10.4)
Chloride: 101 mmol/L (ref 98–110)
Creat: 1.07 mg/dL — ABNORMAL HIGH (ref 0.50–0.99)
GFR, Est African American: 62 mL/min/{1.73_m2} (ref >=60)
GFR, Est Non African American: 54 mL/min/{1.73_m2} — ABNORMAL LOW (ref >=60)
Globulin: 2.4 g/dL (calc) (ref 1.9–3.7)
Glucose, Bld: 106 mg/dL — ABNORMAL HIGH (ref 65–99)
Potassium: 5.1 mmol/L (ref 3.5–5.3)
Sodium: 139 mmol/L (ref 135–146)
Total Bilirubin: 1 mg/dL (ref 0.2–1.2)
Total Protein: 7.2 g/dL (ref 6.1–8.1)

## 2020-06-16 LAB — TSH: TSH: 2.18 mIU/L (ref 0.40–4.50)

## 2020-12-15 ENCOUNTER — Other Ambulatory Visit: Payer: Self-pay | Admitting: Internal Medicine

## 2020-12-16 ENCOUNTER — Other Ambulatory Visit: Payer: Self-pay

## 2020-12-16 ENCOUNTER — Encounter: Payer: Self-pay | Admitting: Internal Medicine

## 2020-12-16 ENCOUNTER — Ambulatory Visit (INDEPENDENT_AMBULATORY_CARE_PROVIDER_SITE_OTHER): Payer: Medicare Other | Admitting: Internal Medicine

## 2020-12-16 VITALS — BP 120/90 | HR 74 | Ht 61.25 in | Wt 187.0 lb

## 2020-12-16 DIAGNOSIS — E785 Hyperlipidemia, unspecified: Secondary | ICD-10-CM

## 2020-12-16 DIAGNOSIS — E782 Mixed hyperlipidemia: Secondary | ICD-10-CM

## 2020-12-16 DIAGNOSIS — R7302 Impaired glucose tolerance (oral): Secondary | ICD-10-CM | POA: Diagnosis not present

## 2020-12-16 DIAGNOSIS — I10 Essential (primary) hypertension: Secondary | ICD-10-CM | POA: Diagnosis not present

## 2020-12-16 NOTE — Patient Instructions (Signed)
Thank you for coming in today.  It was a pleasure to see you.  Continue current medications as we discussed and follow-up with Medicare wellness and health maintenance exam in 6 months.  Had mammogram in June.  Get COVID booster in the near future.  May get Shingrix vaccine as well but not at the same time as COVID booster.

## 2020-12-16 NOTE — Progress Notes (Signed)
   Subjective:    Patient ID: Stephanie Hernandez, female    DOB: 1950-09-08, 70 y.o.   MRN: [id number]  HPI 70 year old Female seen for 6 month recheck. She is doing great having tapered off Lamictal. Has 2 part time clerical jobs. Is very happy with these. Staying well. Has not had Covid. Labs drawn today include Hgb AIC, lipid panel and B-met.  Anxiety has improved and she no longer has to see Noemi Chapel for counseling.  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  No stress.  Can work at her own pace.  Hypertension treated with losartan 25 mg daily and pindolol 5 mg 1 or 2 tablets daily for palpitations.  Continues with Xanax up to 3 times daily as needed.  Continues with Effexor Exar 75 mg daily.  Review of Systems see above-no new complaints     Objective:   Physical Exam Blood pressure 120/90 pulse 74 pulse oximetry 98% Weight 197 pounds Height 5 feet 1.25 inches BMI 35.05  Neck is supple without thyromegaly or carotid bruits.  No adenopathy.  Chest is clear to auscultation without rales or wheezing.  Cardiac exam: Regular rate and rhythm normal S1 and S2 without murmurs or gallops.  No lower extremity pitting edema.  Affect, thought, and judgment are entirely normal.       Assessment & Plan:  Essential hypertension-stable on current regimen  Anxiety state-much improved with retirement.  Happy with 2 part-time jobs.  She is off Lamictal.  Continue with Effexor and Xanax.  History of mixed hyperlipidemia-total cholesterol in October 2021 was 213, triglycerides 179, LDL 125.  In May 2021 LDL was 115 and triglycerides and total cholesterol were normal.  She has not wanted to be on statin medication.  Lipid panel drawn today fasting and is pending.  Right knee pain-saw Dr. Ninfa Linden in May 2021.  Voltaren gel and Mobic  recommended.  X-ray of right knee showed no acute findings.  Slight medial joint space narrowing and slight patellofemoral narrowing.  Plan: We will review lab work and communicate results to her with recommendations.  Colonoscopy is up-to-date.  She will get COVID booster.  Mammogram due in June.  Return for Medicare wellness and health maintenance exam in 6 months.  Asked patient to discontinue aspirin.  It is no longer recommended for her situation.  Continue with vitamin D supplement.  May take multivitamin.  May get Shingrix vaccine but not at the same time as COVID booster.

## 2020-12-17 LAB — HEMOGLOBIN A1C
Hgb A1c MFr Bld: 5.3 % of total Hgb (ref <5.7)
Mean Plasma Glucose: 105 mg/dL
eAG (mmol/L): 5.8 mmol/L

## 2020-12-17 LAB — LIPID PANEL
Cholesterol: 183 mg/dL (ref <200)
HDL: 50 mg/dL (ref >=50)
LDL Cholesterol (Calc): 104 mg/dL (calc) — ABNORMAL HIGH
Non-HDL Cholesterol (Calc): 133 mg/dL (calc) — ABNORMAL HIGH (ref <130)
Total CHOL/HDL Ratio: 3.7 (calc) (ref <5.0)
Triglycerides: 171 mg/dL — ABNORMAL HIGH (ref <150)

## 2020-12-17 LAB — BASIC METABOLIC PANEL
BUN: 11 mg/dL (ref 7–25)
CO2: 28 mmol/L (ref 20–32)
Calcium: 10.5 mg/dL — ABNORMAL HIGH (ref 8.6–10.4)
Chloride: 101 mmol/L (ref 98–110)
Creat: 0.86 mg/dL (ref 0.50–0.99)
Glucose, Bld: 100 mg/dL — ABNORMAL HIGH (ref 65–99)
Potassium: 5.3 mmol/L (ref 3.5–5.3)
Sodium: 137 mmol/L (ref 135–146)

## 2021-01-04 ENCOUNTER — Other Ambulatory Visit: Payer: Self-pay

## 2021-01-04 ENCOUNTER — Other Ambulatory Visit: Payer: Medicare Other | Admitting: Internal Medicine

## 2021-01-04 NOTE — Addendum Note (Signed)
Addended by: Gregery Na on: 01/04/2021 08:57 AM   Modules accepted: Orders

## 2021-01-05 LAB — PTH, INTACT AND CALCIUM
Calcium: 9.9 mg/dL (ref 8.6–10.4)
PTH: 37 pg/mL (ref 16–77)

## 2021-01-10 ENCOUNTER — Other Ambulatory Visit: Payer: Self-pay | Admitting: Internal Medicine

## 2021-01-10 DIAGNOSIS — Z1231 Encounter for screening mammogram for malignant neoplasm of breast: Secondary | ICD-10-CM

## 2021-03-04 ENCOUNTER — Telehealth: Payer: Self-pay | Admitting: Internal Medicine

## 2021-03-04 ENCOUNTER — Other Ambulatory Visit: Payer: Self-pay

## 2021-03-04 ENCOUNTER — Telehealth (INDEPENDENT_AMBULATORY_CARE_PROVIDER_SITE_OTHER): Payer: Medicare Other | Admitting: Internal Medicine

## 2021-03-04 ENCOUNTER — Encounter: Payer: Self-pay | Admitting: Internal Medicine

## 2021-03-04 VITALS — BP 126/83 | HR 86 | Temp 98.2°F

## 2021-03-04 DIAGNOSIS — Z8659 Personal history of other mental and behavioral disorders: Secondary | ICD-10-CM | POA: Diagnosis not present

## 2021-03-04 DIAGNOSIS — I10 Essential (primary) hypertension: Secondary | ICD-10-CM

## 2021-03-04 DIAGNOSIS — Z87898 Personal history of other specified conditions: Secondary | ICD-10-CM

## 2021-03-04 DIAGNOSIS — U071 COVID-19: Secondary | ICD-10-CM | POA: Diagnosis not present

## 2021-03-04 NOTE — Telephone Encounter (Signed)
scheduled

## 2021-03-04 NOTE — Telephone Encounter (Signed)
Stephanie Hernandez [phone]  Ruthanne called to say she tested positive for COVID yesterday, she is having stuffy nose, Headache, body aches, cough off and on, fever night, 99.9, this morning fever 98.4, and sneezing. Took some mucinex last night.

## 2021-03-07 ENCOUNTER — Encounter: Payer: Self-pay | Admitting: Internal Medicine

## 2021-03-07 NOTE — Progress Notes (Signed)
   Subjective:    Patient ID: Stephanie Hernandez, female    DOB: 1951/05/27, 70 y.o.   MRN: [id number]  HPI 70 year old Female seen by interactive audio and video communications due to the Coronavirus pandemic.    Patient is at her home in [place], [place] and I am in my office.  She is agreeable to visit in this format today.  Patient has tested positive for COVID-19 via home test.  She has no respiratory distress.  She has some mild myalgias, sinus pressure ,headache, and slight cough.  She has had 3 COVID-19 immunizations the last one on file is June 24, 2020.  She is seen virtually in no acute distress but is slightly pale.  Patient has a history of hypertension which is well controlled.  History of palpitations that are well controlled with pindolol as well.  She was seen in April for 39-month recheck and will have annual physical exam and Medicare wellness visit in October.  History of anxiety treated with Xanax and Effexor and under good control.  She recently had first Shingrix vaccine in late June.  Review of Systems No nausea or vomiting. No SOB.     Objective:   Physical Exam Reports Temp currently 98.2 degrees.  She is able to give a clear concise history.  Not heard to be coughing during this interview.  Slightly nasally congested.  Very slight paleness.       Assessment & Plan:  Acute COVID-19 virus infection  Plan: We discussed treatment with Paxlovid oral antiviral treatment versus symptomatic treatment and watching closely.  She prefers the latter.  She will stay well-hydrated.  She will call me if symptoms worsen.  May take Tylenol for fever.  Needs to quarantine for 5 days.

## 2021-03-07 NOTE — Patient Instructions (Addendum)
We are sorry to hear you are not feeling well.  Please quarantine for 5 days.  May take Tylenol for fever.  Call if symptoms worsen.  Stay well-hydrated.  Continue prescription medicines as prescribed.

## 2021-03-08 ENCOUNTER — Ambulatory Visit: Payer: Medicare Other

## 2021-03-13 ENCOUNTER — Other Ambulatory Visit: Payer: Self-pay | Admitting: Internal Medicine

## 2021-05-03 ENCOUNTER — Other Ambulatory Visit: Payer: Self-pay

## 2021-05-03 ENCOUNTER — Ambulatory Visit
Admission: RE | Admit: 2021-05-03 | Discharge: 2021-05-03 | Disposition: A | Discharge status: Home / Self Care | Payer: Medicare Other | Source: Ambulatory Visit | Attending: Internal Medicine | Admitting: Internal Medicine

## 2021-05-03 DIAGNOSIS — Z1231 Encounter for screening mammogram for malignant neoplasm of breast: Secondary | ICD-10-CM

## 2021-06-17 ENCOUNTER — Other Ambulatory Visit: Payer: Self-pay

## 2021-06-17 ENCOUNTER — Ambulatory Visit (INDEPENDENT_AMBULATORY_CARE_PROVIDER_SITE_OTHER): Payer: Medicare Other | Admitting: Internal Medicine

## 2021-06-17 ENCOUNTER — Encounter: Payer: Self-pay | Admitting: Internal Medicine

## 2021-06-17 VITALS — BP 136/82 | HR 73 | Temp 98.3°F | Ht 61.0 in | Wt 191.0 lb

## 2021-06-17 DIAGNOSIS — Z87898 Personal history of other specified conditions: Secondary | ICD-10-CM | POA: Diagnosis not present

## 2021-06-17 DIAGNOSIS — I10 Essential (primary) hypertension: Secondary | ICD-10-CM | POA: Diagnosis not present

## 2021-06-17 DIAGNOSIS — F419 Anxiety disorder, unspecified: Secondary | ICD-10-CM

## 2021-06-17 DIAGNOSIS — Z6836 Body mass index (BMI) 36.0-36.9, adult: Secondary | ICD-10-CM

## 2021-06-17 DIAGNOSIS — E785 Hyperlipidemia, unspecified: Secondary | ICD-10-CM

## 2021-06-17 DIAGNOSIS — Z0000 Encounter for general adult medical examination without abnormal findings: Secondary | ICD-10-CM | POA: Diagnosis not present

## 2021-06-17 DIAGNOSIS — Z8616 Personal history of COVID-19: Secondary | ICD-10-CM

## 2021-06-17 DIAGNOSIS — Z1329 Encounter for screening for other suspected endocrine disorder: Secondary | ICD-10-CM

## 2021-06-17 DIAGNOSIS — G4700 Insomnia, unspecified: Secondary | ICD-10-CM

## 2021-06-17 DIAGNOSIS — Z8639 Personal history of other endocrine, nutritional and metabolic disease: Secondary | ICD-10-CM | POA: Diagnosis not present

## 2021-06-17 DIAGNOSIS — R7302 Impaired glucose tolerance (oral): Secondary | ICD-10-CM

## 2021-06-17 LAB — POCT URINALYSIS DIPSTICK
Bilirubin, UA: NEGATIVE
Blood, UA: NEGATIVE
Glucose, UA: NEGATIVE
Leukocytes, UA: NEGATIVE
Nitrite, UA: NEGATIVE
Protein, UA: NEGATIVE
Spec Grav, UA: 1.01 (ref 1.010–1.025)
Urobilinogen, UA: 0.2 E.U./dL
pH, UA: 7 (ref 5.0–8.0)

## 2021-06-17 NOTE — Progress Notes (Signed)
Annual Wellness Visit     Patient: Stephanie Hernandez, Female    DOB: March 06, 1951, 70 y.o.   MRN: [id number]  Subjective    Stephanie Hernandez is a 70 y.o. female who presents today for her Annual Wellness Visit and health maintenance exam as well as evaluation of medical issues.  HPI She has a history of HTN, anxiety and depression. Had mild case of Covid-19 in July. Did not take Paxlovid.    Patient Care Team: Margaree Mackintosh, MD as PCP - General (Internal Medicine)  Review of Systems  Constitutional: Negative.   Respiratory: Negative.    Cardiovascular: Negative.   Gastrointestinal: Negative.   Genitourinary: Negative.   Neurological: Negative.   Psychiatric/Behavioral: Negative.    Hx anxiety which is stable at the present time.  Hypertension is stable.   Objective    Vitals: BP 136/82   Pulse 73   Temp 98.3 F (36.8 C) (Tympanic)   Ht 5\' 1"  (1.549 m)   Wt 191 lb (86.6 kg)   SpO2 99%   BMI 36.09 kg/m   Physical Exam Vitals reviewed.  Constitutional:      General: She is not in acute distress.    Appearance: Normal appearance.  HENT:     Head: Normocephalic and atraumatic.     Right Ear: Tympanic membrane normal.     Left Ear: Tympanic membrane normal.     Nose: Nose normal.     Mouth/Throat:     Pharynx: Oropharynx is clear.  Eyes:     General:        Right eye: No discharge.        Left eye: No discharge.     Extraocular Movements: Extraocular movements intact.     Conjunctiva/sclera: Conjunctivae normal.     Pupils: Pupils are equal, round, and reactive to light.  Neck:     Vascular: No carotid bruit.     Comments: No thyromegaly Cardiovascular:     Rate and Rhythm: Normal rate and regular rhythm.     Heart sounds: No murmur heard. Pulmonary:     Effort: Pulmonary effort is normal.     Breath sounds: Normal breath sounds. No rales.  Abdominal:     Palpations: Abdomen is soft. There is no mass.     Tenderness: There is no abdominal tenderness. There is  no guarding or rebound.     Hernia: No hernia is present.  Genitourinary:    Comments: Bimanual exam is normal Musculoskeletal:     Cervical back: Neck supple. No rigidity.     Right lower leg: No edema.     Left lower leg: No edema.  Skin:    General: Skin is warm and dry.     Findings: No rash.  Neurological:     General: No focal deficit present.     Mental Status: She is alert and oriented to person, place, and time.     Cranial Nerves: No cranial nerve deficit.     Gait: Gait normal.  Psychiatric:        Mood and Affect: Mood normal.        Behavior: Behavior normal.        Thought Content: Thought content normal.        Judgment: Judgment normal.   see below   Most recent functional status assessment: In your present state of health, do you have any difficulty performing the following activities: 06/17/2021  Hearing? N  Vision? N  Difficulty concentrating or making decisions? N  Walking or climbing stairs? N  Dressing or bathing? N  Doing errands, shopping? N  Preparing Food and eating ? N  In the past six months, have you accidently leaked urine? N  Do you have problems with loss of bowel control? N  Managing your Medications? N  Managing your Finances? N  Housekeeping or managing your Housekeeping? N  Some recent data might be hidden   Most recent fall risk assessment: Fall Risk  06/17/2021  Falls in the past year? 0  Number falls in past yr: 0  Injury with Fall? 0  Risk for fall due to : No Fall Risks  Follow up Falls evaluation completed    Most recent depression screenings: PHQ 2/9 Scores 06/17/2021 06/15/2020  PHQ - 2 Score 0 0  PHQ- 9 Score - -   Most recent cognitive screening: 6CIT Screen 06/17/2021  What Year? 0 points  What month? 0 points  What time? 0 points  Count back from 20 0 points  Months in reverse 0 points  Repeat phrase 0 points  Total Score 0       Assessment & Plan     Annual wellness visit done today including the all  of the following: Reviewed patient's Family Medical History Reviewed and updated list of patient's medical providers Assessment of cognitive impairment was done Assessed patient's functional ability Established a written schedule for health screening services Health Risk Assessent Completed and Reviewed  Discussed health benefits of physical activity, and encouraged her to engage in regular exercise appropriate for her age and condition.            Jama Flavors, CMA    Patient presents for annual health maintenance exam  and evaluation of medical issues.  History of palpitations treated with beta-blocker (pindolol) and stable.  History of anxiety treated with Effexor and Xanax.  Has seen Ellis Savage for counseling but since she has retired from job at nursing home, she may not need to continue counseling she says.  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  Had colon[place] in [place] in August 2019 with 10-year follow-up recommended.  History of herpes zoster 2018 treated with Valtrex.  Recent COVID-19 infection was low.  Severe contact dermatitis with cellulitis requiring antibiotics and steroids in 2020.  Past medical history: Fractured fifth metatarsal 2004.  Became menopausal around age 72.  Tetanus immunization April 2012.  No history of operations except for benign left breast biopsy December 2015.  Social history: Single, never married.  Completed 1 year of college.  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  Does not smoke or consume alcohol.  Resides alone.  No children.  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  Family history: Mother died at age 29 of respiratory failure.  Father died at age 19 suddenly.  Maternal uncle with history of colon cancer in his 41s.  Paternal grandmother with history of uterine cancer.  BMI is 36.09 and weight is 191 pounds.  Has gained  10 pounds since October 2021.  Diet and exercise discussed.  Impression: Her labs are stable and within normal limits.  Her blood pressure is stable on current regimen.  She needs to lose some weight.  Has gained 10 pounds in the past year.  She will work on getting more exercise.  She can return in 6  months.  No change in medications.

## 2021-06-18 LAB — CBC WITH DIFFERENTIAL/PLATELET
Absolute Monocytes: 578 cells/uL (ref 200–950)
Basophils Absolute: 0 cells/uL (ref 0–200)
Basophils Relative: 0 %
Eosinophils Absolute: 109 cells/uL (ref 15–500)
Eosinophils Relative: 1.6 %
HCT: 42.7 % (ref 35.0–45.0)
Hemoglobin: 13.9 g/dL (ref 11.7–15.5)
Lymphs Abs: 1591 cells/uL (ref 850–3900)
MCH: 29.9 pg (ref 27.0–33.0)
MCHC: 32.6 g/dL (ref 32.0–36.0)
MCV: 91.8 fL (ref 80.0–100.0)
MPV: 12.4 fL (ref 7.5–12.5)
Monocytes Relative: 8.5 %
Neutro Abs: 4522 cells/uL (ref 1500–7800)
Neutrophils Relative %: 66.5 %
Platelets: 314 10*3/uL (ref 140–400)
RBC: 4.65 10*6/uL (ref 3.80–5.10)
RDW: 12.4 % (ref 11.0–15.0)
Total Lymphocyte: 23.4 %
WBC: 6.8 10*3/uL (ref 3.8–10.8)

## 2021-06-18 LAB — COMPLETE METABOLIC PANEL WITH GFR
AG Ratio: 2 (calc) (ref 1.0–2.5)
ALT: 16 U/L (ref 6–29)
AST: 18 U/L (ref 10–35)
Albumin: 4.6 g/dL (ref 3.6–5.1)
Alkaline phosphatase (APISO): 95 U/L (ref 37–153)
BUN: 14 mg/dL (ref 7–25)
CO2: 28 mmol/L (ref 20–32)
Calcium: 10.1 mg/dL (ref 8.6–10.4)
Chloride: 101 mmol/L (ref 98–110)
Creat: 0.89 mg/dL (ref 0.50–1.05)
Globulin: 2.3 g/dL (calc) (ref 1.9–3.7)
Glucose, Bld: 97 mg/dL (ref 65–99)
Potassium: 5 mmol/L (ref 3.5–5.3)
Sodium: 138 mmol/L (ref 135–146)
Total Bilirubin: 0.6 mg/dL (ref 0.2–1.2)
Total Protein: 6.9 g/dL (ref 6.1–8.1)
eGFR: 71 mL/min/{1.73_m2} (ref >=60)

## 2021-06-18 LAB — LIPID PANEL
Cholesterol: 169 mg/dL (ref <200)
HDL: 51 mg/dL (ref >=50)
LDL Cholesterol (Calc): 93 mg/dL (calc)
Non-HDL Cholesterol (Calc): 118 mg/dL (calc) (ref <130)
Total CHOL/HDL Ratio: 3.3 (calc) (ref <5.0)
Triglycerides: 149 mg/dL (ref <150)

## 2021-06-18 LAB — HEMOGLOBIN A1C
Hgb A1c MFr Bld: 5.4 % of total Hgb (ref <5.7)
Mean Plasma Glucose: 108 mg/dL
eAG (mmol/L): 6 mmol/L

## 2021-06-18 LAB — TSH: TSH: 1.49 mIU/L (ref 0.40–4.50)

## 2021-06-18 NOTE — Patient Instructions (Signed)
Labs are normal.  Work on diet and exercise.  Return in 6 months.  It was a pleasure to see you today.

## 2021-12-12 ENCOUNTER — Other Ambulatory Visit: Payer: Self-pay | Admitting: Internal Medicine

## 2021-12-20 ENCOUNTER — Ambulatory Visit (INDEPENDENT_AMBULATORY_CARE_PROVIDER_SITE_OTHER): Payer: Medicare Other | Admitting: Internal Medicine

## 2021-12-20 ENCOUNTER — Encounter: Payer: Self-pay | Admitting: Internal Medicine

## 2021-12-20 VITALS — BP 122/86 | HR 73 | Temp 97.6°F | Ht 61.0 in | Wt 192.5 lb

## 2021-12-20 DIAGNOSIS — Z8659 Personal history of other mental and behavioral disorders: Secondary | ICD-10-CM | POA: Diagnosis not present

## 2021-12-20 DIAGNOSIS — I10 Essential (primary) hypertension: Secondary | ICD-10-CM | POA: Diagnosis not present

## 2021-12-20 DIAGNOSIS — R7302 Impaired glucose tolerance (oral): Secondary | ICD-10-CM | POA: Diagnosis not present

## 2021-12-20 DIAGNOSIS — Z87898 Personal history of other specified conditions: Secondary | ICD-10-CM | POA: Diagnosis not present

## 2021-12-20 DIAGNOSIS — Z6836 Body mass index (BMI) 36.0-36.9, adult: Secondary | ICD-10-CM

## 2021-12-20 DIAGNOSIS — E782 Mixed hyperlipidemia: Secondary | ICD-10-CM

## 2021-12-20 NOTE — Patient Instructions (Addendum)
It was a pleasure to see you today. Continue same meds. We can refill Xanax and Venlafaxine when needed. See you for CPE and medicare wellness in 6 months. Hgb AIC checked today ?

## 2021-12-20 NOTE — Progress Notes (Signed)
? ?  Subjective:  ? ? Patient ID: Stephanie Hernandez, female    DOB: 06-09-1951, 71 y.o.   MRN: [id number] ? ?HPI  71 year old for 6 month recheck. Had colonoscopy in 2018 with 10-year follow-up recommended. Vaccines discussed.  Had Tdap in February 2023.  We will have flu vaccine this fall.  He has had pneumococcal 13 and pneumococcal 23 vaccines.  Has had Shingrix vaccines.  Last COVID-vaccine was October 2022.  Did have a mild case of COVID-19 in July 2022. ? ?Stephanie Hernandez has released her from counseling.  She does not have situational stress anymore and is doing well on current medications.  I have agreed to refill those medications as needed which are Effexor XR 75 mg daily and generic Xanax 0.5 mg up to 3 times daily if needed. ? ?She remains on Pindolol 1 or 2 tablets daily for palpitations and losartan 25 mg daily for HTN. ? ?Her blood pressure is excellent at 122/86.  BMI is 36.37 and weight is 192 pounds 8 ounces.  He has gained 1 pound 8 ounces since October 2022. ? ?She feels well and looks great. ?In October 2022 her lipid panel was completely normal and her hemoglobin A1c was excellent at 5.4% ? ? ?Review of Systems Had laceration left third finger in February and received tetanus immunization. Seen at urgent care in [place]. ? ?   ?Objective:  ? Physical Exam ?Blood pressure 122/86 pulse 73 temperature 97.6 degrees pulse oximetry 98% weight 192 pounds 8 ounces BMI 36.37 ?Skin: Warm and dry.  No thyromegaly.  No carotid bruits.  Chest clear.  Cardiac exam regular rate and rhythm normal S1 and S2 without ectopy or murmurs.  No lower extremity pitting edema.  Affect, thought and judgment are normal. ? ? ?   ?Assessment & Plan:  ?Essential hypertension stable on current regimen ? ?History of palpitations treated with pindolol ? ?History of anxiety and depression treated with Xanax and Effexor and stable.  Stephanie Hernandez has released her from counseling. ? ?Screening for diabetes mellitus-hemoglobin A1c 5.5% and  within normal limits. ? ?Plan: Continue current medications and return in 6 months for Medicare wellness and health maintenance exam. ? ? ? ? ? ?

## 2021-12-21 LAB — HEMOGLOBIN A1C
Hgb A1c MFr Bld: 5.5 % of total Hgb (ref <5.7)
Mean Plasma Glucose: 111 mg/dL
eAG (mmol/L): 6.2 mmol/L

## 2022-03-31 ENCOUNTER — Other Ambulatory Visit: Payer: Self-pay | Admitting: Internal Medicine

## 2022-03-31 DIAGNOSIS — Z1231 Encounter for screening mammogram for malignant neoplasm of breast: Secondary | ICD-10-CM

## 2022-05-12 ENCOUNTER — Ambulatory Visit
Admission: RE | Admit: 2022-05-12 | Discharge: 2022-05-12 | Disposition: A | Discharge status: Home / Self Care | Payer: Medicare Other | Source: Ambulatory Visit | Attending: Internal Medicine | Admitting: Internal Medicine

## 2022-05-12 DIAGNOSIS — Z1231 Encounter for screening mammogram for malignant neoplasm of breast: Secondary | ICD-10-CM

## 2022-06-19 ENCOUNTER — Other Ambulatory Visit: Payer: Medicare Other

## 2022-06-19 DIAGNOSIS — I10 Essential (primary) hypertension: Secondary | ICD-10-CM

## 2022-06-19 DIAGNOSIS — Z1329 Encounter for screening for other suspected endocrine disorder: Secondary | ICD-10-CM

## 2022-06-19 DIAGNOSIS — E782 Mixed hyperlipidemia: Secondary | ICD-10-CM

## 2022-06-19 DIAGNOSIS — R7302 Impaired glucose tolerance (oral): Secondary | ICD-10-CM

## 2022-06-20 LAB — CBC WITH DIFFERENTIAL/PLATELET
Absolute Monocytes: 413 cells/uL (ref 200–950)
Basophils Absolute: 21 cells/uL (ref 0–200)
Basophils Relative: 0.3 %
Eosinophils Absolute: 91 cells/uL (ref 15–500)
Eosinophils Relative: 1.3 %
HCT: 44.2 % (ref 35.0–45.0)
Hemoglobin: 14.3 g/dL (ref 11.7–15.5)
Lymphs Abs: 1484 cells/uL (ref 850–3900)
MCH: 30 pg (ref 27.0–33.0)
MCHC: 32.4 g/dL (ref 32.0–36.0)
MCV: 92.7 fL (ref 80.0–100.0)
MPV: 12.3 fL (ref 7.5–12.5)
Monocytes Relative: 5.9 %
Neutro Abs: 4991 cells/uL (ref 1500–7800)
Neutrophils Relative %: 71.3 %
Platelets: 280 10*3/uL (ref 140–400)
RBC: 4.77 10*6/uL (ref 3.80–5.10)
RDW: 12.5 % (ref 11.0–15.0)
Total Lymphocyte: 21.2 %
WBC: 7 10*3/uL (ref 3.8–10.8)

## 2022-06-20 LAB — LIPID PANEL
Cholesterol: 193 mg/dL (ref <200)
HDL: 58 mg/dL (ref >=50)
LDL Cholesterol (Calc): 109 mg/dL (calc) — ABNORMAL HIGH
Non-HDL Cholesterol (Calc): 135 mg/dL (calc) — ABNORMAL HIGH (ref <130)
Total CHOL/HDL Ratio: 3.3 (calc) (ref <5.0)
Triglycerides: 145 mg/dL (ref <150)

## 2022-06-20 LAB — TSH: TSH: 1.6 mIU/L (ref 0.40–4.50)

## 2022-06-20 LAB — COMPLETE METABOLIC PANEL WITH GFR
AG Ratio: 1.8 (calc) (ref 1.0–2.5)
ALT: 18 U/L (ref 6–29)
AST: 18 U/L (ref 10–35)
Albumin: 4.4 g/dL (ref 3.6–5.1)
Alkaline phosphatase (APISO): 102 U/L (ref 37–153)
BUN: 19 mg/dL (ref 7–25)
CO2: 26 mmol/L (ref 20–32)
Calcium: 10.4 mg/dL (ref 8.6–10.4)
Chloride: 101 mmol/L (ref 98–110)
Creat: 0.84 mg/dL (ref 0.50–1.05)
Globulin: 2.5 g/dL (calc) (ref 1.9–3.7)
Glucose, Bld: 94 mg/dL (ref 65–99)
Potassium: 4.9 mmol/L (ref 3.5–5.3)
Sodium: 137 mmol/L (ref 135–146)
Total Bilirubin: 0.8 mg/dL (ref 0.2–1.2)
Total Protein: 6.9 g/dL (ref 6.1–8.1)
eGFR: 75 mL/min/{1.73_m2} (ref >=60)

## 2022-06-20 LAB — MICROALBUMIN, URINE: Microalb, Ur: 0.3 mg/dL

## 2022-06-20 LAB — HEMOGLOBIN A1C
Hgb A1c MFr Bld: 5.6 % of total Hgb (ref <5.7)
Mean Plasma Glucose: 114 mg/dL
eAG (mmol/L): 6.3 mmol/L

## 2022-06-21 ENCOUNTER — Encounter: Payer: Self-pay | Admitting: Internal Medicine

## 2022-06-21 ENCOUNTER — Ambulatory Visit (INDEPENDENT_AMBULATORY_CARE_PROVIDER_SITE_OTHER): Payer: Medicare Other | Admitting: Internal Medicine

## 2022-06-21 VITALS — BP 108/74 | HR 77 | Temp 97.5°F | Ht 61.0 in | Wt 189.1 lb

## 2022-06-21 DIAGNOSIS — Z23 Encounter for immunization: Secondary | ICD-10-CM

## 2022-06-21 DIAGNOSIS — Z87898 Personal history of other specified conditions: Secondary | ICD-10-CM

## 2022-06-21 DIAGNOSIS — G4700 Insomnia, unspecified: Secondary | ICD-10-CM

## 2022-06-21 DIAGNOSIS — Z0000 Encounter for general adult medical examination without abnormal findings: Secondary | ICD-10-CM

## 2022-06-21 DIAGNOSIS — I10 Essential (primary) hypertension: Secondary | ICD-10-CM | POA: Diagnosis not present

## 2022-06-21 DIAGNOSIS — Z8659 Personal history of other mental and behavioral disorders: Secondary | ICD-10-CM

## 2022-06-21 DIAGNOSIS — R7302 Impaired glucose tolerance (oral): Secondary | ICD-10-CM | POA: Diagnosis not present

## 2022-06-21 DIAGNOSIS — E782 Mixed hyperlipidemia: Secondary | ICD-10-CM

## 2022-06-21 DIAGNOSIS — Z7185 Encounter for immunization safety counseling: Secondary | ICD-10-CM

## 2022-06-21 LAB — POCT URINALYSIS DIPSTICK
Bilirubin, UA: NEGATIVE
Blood, UA: NORMAL
Glucose, UA: NEGATIVE
Ketones, UA: NEGATIVE
Leukocytes, UA: NEGATIVE
Nitrite, UA: NEGATIVE
Protein, UA: NEGATIVE
Spec Grav, UA: 1.01 (ref 1.010–1.025)
Urobilinogen, UA: 0.2 E.U./dL
pH, UA: 5 (ref 5.0–8.0)

## 2022-06-21 NOTE — Progress Notes (Signed)
Annual Wellness Visit     Patient: Stephanie Hernandez, Female    DOB: 10-13-1950, 71 y.o.   MRN: [id number] Visit Date: 06/21/2022  Chief Complaint  Patient presents with   Annual Exam   Subjective    Stephanie Hernandez is a 71 y.o. Fema who presents today for her Annual Wellness Visit.  HPI She also presents for health maintenance exam and evaluation of medical issues.  She has a history of hypertension, anxiety and depression.  Had a mild case of COVID-19 in July 2022.  Did not take Paxlovid and recovered without sequela.  History of palpitations treated with pindolol once stable.  History of anxiety treated with Effexor and Xanax.  Had colonoscopy in [place] August 2018 with 10-year follow-up recommended.  Had herpes zoster 2018 treated with Valtrex.  Immunizations discussed today.  Had tetanus immunization April 2023.  Has had Shingrix series.   Recommend COVID booster.  May want to consider RSV vaccine since she is active at [employer].  Pneumococcal 20 vaccine given today.  Past medical history: Fractured fifth metatarsal 2004.  Became menopausal around age 71.  Tetanus immunization April 2021.  No history of operations except for benign left breast biopsy December 2015.  BMI 35.73.  Weight 189 pounds 1.9 ounces height 5 feet 1 inches.  Walks for exercise.  Social history: Single.  Never married.  Completed 1 year of college.  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  Does not smoke or consume alcohol.  Resides alone.  No children.  Currently working at 2 different businesses in [place] and is enjoying her work.  Family history: Mother died at age 66 of respiratory failure.  Father died at age 68 suddenly.  Maternal uncle with history of colon cancer in his 44s.  Paternal grandmother with history of uterine cancer.    Review of Systems no new complaints      Vitals: BP 108/74   Pulse 77   Temp (!) 97.5 F (36.4 C) (Tympanic)   Ht 5\' 1"  (1.549 m)   Wt 189 lb 1.9 oz (85.8  kg)   SpO2 99%   BMI 35.73 kg/m   Physical Exam  Skin: Warm and dry.  No cervical adenopathy.  No thyromegaly.  No carotid bruits.  Chest clear.  Cardiac exam regular rate and rhythm normal S1 and S2 without murmur.  Breast are without masses.  Abdomen is soft nondistended without hepatosplenomegaly masses or tenderness.  Bimanual exam is normal.  Neuro is intact without gross focal deficits.  Affect thought and judgment appear to be normal.   Most recent functional status assessment:    06/21/2022   10:09 AM  In your present state of health, do you have any difficulty performing the following activities:  Hearing? 0  Difficulty concentrating or making decisions? 0  Walking or climbing stairs? 0  Doing errands, shopping? 0  Preparing Food and eating ? N  Using the Toilet? N  In the past six months, have you accidently leaked urine? N  Do you have problems with loss of bowel control? N  Managing your Medications? N  Managing your Finances? N  Housekeeping or managing your Housekeeping? N   Most recent fall risk assessment:    06/21/2022   10:08 AM  Fall Risk   Falls in the past year? 0  Number falls in past yr: 0  Injury with Fall? 0  Risk for fall due to : No Fall Risks  Follow up Falls evaluation completed    Most recent depression screenings:    06/21/2022   10:08 AM 06/17/2021   11:13 AM  PHQ 2/9 Scores  PHQ - 2 Score 0 0   Most recent cognitive screening:    06/21/2022   10:09 AM  6CIT Screen  What Year? 0 points  What month? 0 points  What time? 0 points  Count back from 20 0 points  Months in reverse 0 points  Repeat phrase 0 points  Total Score 0 points       Assessment & Plan   BMI 35.73-continue diet exercise and weight loss efforts History of palpitations stable with current medications  Colonoscopy up-to-date  Vaccines discussed  History of palpitations stable with beta-blocker  History of anxiety stable with Effexor and  Xanax  Essential hypertension stable  History of insomnia  Mammogram up-to-date-it was done September 22 and was normal  History of hyperlipidemia-mild elevation of LDL at 109.  Patient may consider coronary calcium scoring.  This was discussed with her today.  Bone density study was in 2012 with lowest T score of -0.8 and left femoral neck.  She may consider bone density study if she so desires in the Fall 2023.  She just needs to let us know to place the order.       Annual wellness visit done today including the all of the following: Reviewed patient's Family Medical History Reviewed and updated list of patient's medical providers Assessment of cognitive impairment was done Assessed patient's functional ability Established a written schedule for health screening [place] Completed and Reviewed  Discussed health benefits of physical activity, and encouraged her to engage in regular exercise appropriate for her age and condition.         {I, Elby Showers, MD, have reviewed all documentation for this visit. The documentation on 07/02/22 for the exam, diagnosis, procedures, and orders are all accurate and complete.   LaVon Barron Alvine, CMA

## 2022-07-02 NOTE — Patient Instructions (Addendum)
Patient to get flu vaccine at pharmacy. Other vaccines discussed. Labs are stable. RTC in 6 months.

## 2022-08-16 ENCOUNTER — Telehealth: Payer: Self-pay | Admitting: Internal Medicine

## 2022-08-16 NOTE — Telephone Encounter (Signed)
Stephanie Hernandez [phone]  [name] called to say she has a stopped up right ear, sometimes it hurts, it bothers her more in the mornings and also sometimes her face hurts so she thinks it might be coming from her sinuses, but she has know other symptoms. This has been going on for couple of weeks. She has tried Muscinex.

## 2022-08-17 ENCOUNTER — Ambulatory Visit (INDEPENDENT_AMBULATORY_CARE_PROVIDER_SITE_OTHER): Payer: Medicare Other | Admitting: Internal Medicine

## 2022-08-17 ENCOUNTER — Encounter: Payer: Self-pay | Admitting: Internal Medicine

## 2022-08-17 VITALS — BP 110/76 | HR 73 | Temp 98.8°F | Wt 196.1 lb

## 2022-08-17 DIAGNOSIS — H6501 Acute serous otitis media, right ear: Secondary | ICD-10-CM | POA: Diagnosis not present

## 2022-08-17 DIAGNOSIS — H6121 Impacted cerumen, right ear: Secondary | ICD-10-CM | POA: Diagnosis not present

## 2022-08-17 MED ORDER — AZITHROMYCIN 250 MG PO TABS: ORAL_TABLET | ORAL | 0 refills | Status: AC

## 2022-08-17 NOTE — Progress Notes (Signed)
   Subjective:    Patient ID: Stephanie Hernandez, female    DOB: 04-27-1951, 71 y.o.   MRN: [id number]  HPI 71 year old Female seen for  hearing loss right ear.  No fever or sore throat.  No headache or chills.  Sometimes she has right ear discomfort.  Seems to be worse in the mornings.  Sometimes her face hurts so she thinks it might be a sinus infection.  This has been going on for couple of weeks.  She tried Mucinex without relief.  No headache. She has a history of hypertension which is stable on current regimen.   Review of Systems see above     Objective:   Physical Exam Vital signs reviewed.  She is afebrile.  Blood pressure 110/76, pulse 73, temperature 98.8 degrees pulse oximetry 98% weight 196 pounds 1.9 ounces BMI 37.06  Skin: Warm and dry.  She has an acute impaction with cerumen of her right external ear canal which was removed today with a curette.  However the right TM is also full and pink.  Pharynx is clear.  Neck supple.  Chest clear.     Assessment & Plan:  Cerumen impaction right ear-removed  Acute right serous otitis media  Plan: Zithromax Z-PAK 2 tabs day 1 followed by 1 tab days 2 through 5.  Call if not improving in 5 to 7 days or sooner if worse.  Okay to take over-the-counter decongestant for short amount of time if needed for ear congestion.  I, LaVon D DeBerry, CMA, have reviewed all documentation for this visit. The documentation on 08/17/22 for the exam, diagnosis, procedures, and orders are all accurate and complete.

## 2022-08-17 NOTE — Patient Instructions (Addendum)
Cerumen removed from right ear canal today.  You have also been diagnosed with an acute right serous otitis media for which Zithromax has been prescribed.  Please take 2 tabs day 1 followed by 1 tab days 2 through 5.  Okay to take decongestant for ear congestion.

## 2022-09-08 ENCOUNTER — Other Ambulatory Visit: Payer: Self-pay | Admitting: Internal Medicine

## 2022-10-02 ENCOUNTER — Encounter: Payer: Self-pay | Admitting: Internal Medicine

## 2022-10-02 ENCOUNTER — Ambulatory Visit (INDEPENDENT_AMBULATORY_CARE_PROVIDER_SITE_OTHER): Payer: Medicare Other | Admitting: Internal Medicine

## 2022-10-02 ENCOUNTER — Telehealth: Payer: Self-pay | Admitting: Internal Medicine

## 2022-10-02 VITALS — BP 132/84 | HR 80 | Temp 98.0°F

## 2022-10-02 DIAGNOSIS — H6503 Acute serous otitis media, bilateral: Secondary | ICD-10-CM | POA: Diagnosis not present

## 2022-10-02 MED ORDER — CEFTRIAXONE SODIUM 1 G IJ SOLR
1.0000 g | Freq: Once | INTRAMUSCULAR | Status: AC
  Administered 2022-10-02: 1 g via INTRAMUSCULAR

## 2022-10-02 MED ORDER — METHYLPREDNISOLONE ACETATE 80 MG/ML IJ SUSP
80.0000 mg | Freq: Once | INTRAMUSCULAR | Status: AC
  Administered 2022-10-02: 80 mg via INTRAMUSCULAR

## 2022-10-02 NOTE — Telephone Encounter (Signed)
Stephanie Hernandez [phone]  C[place]nee called to say she has Ear ache, really bad headache, occasional sneezing, runny nose, I have ask her to do COVID test and call me back as soon as possible because we have appointments today and the results of the COVID test will determine what type of visit she will have.

## 2022-10-02 NOTE — Progress Notes (Signed)
Patient Care Team: Elby Showers, MD as PCP - General (Internal Medicine)  Visit Date: 10/02/22  Subjective:    Patient ID: Stephanie Hernandez , Female   DOB: 1950/11/14, 72 y.o.    MRN: [id number]   72 y.o. Female presents today for headache, ear pain. Patient has a past medical history of hypertension.  Lingering ear pain from ear infection in late 12/23 that was treated with Z-Pak. Reports accompanying intractable headache, congestion for past few days. Headache is the worst symptom. Denies soreness of cervical lymph nodes. Denies any sick contact. Reports negative Covid-19 test.   Past Medical History:  Diagnosis Date   Hypertension      Family History  Problem Relation Age of Onset   Heart disease Mother    Heart disease Father     Social History   Social History Narrative   Not on file      Review of Systems  Constitutional:  Negative for fever and malaise/fatigue.  HENT:  Positive for congestion (Sinus and head) and ear pain (Bilateral). Negative for sore throat.   Eyes:  Negative for blurred vision.  Respiratory:  Negative for cough and shortness of breath.   Cardiovascular:  Negative for chest pain, palpitations and leg swelling.  Gastrointestinal:  Negative for vomiting.  Musculoskeletal:  Negative for back pain.  Skin:  Negative for rash.  Neurological:  Negative for loss of consciousness and headaches.        Objective:   Vitals: BP 132/84   Pulse 80   Temp 98 F (36.7 C) (Tympanic)   SpO2 98%    Physical Exam Vitals and nursing note reviewed.  Constitutional:      General: She is not in acute distress.    Appearance: Normal appearance. She is not toxic-appearing.  HENT:     Head: Normocephalic and atraumatic.     Ears:     Comments: Left and right TM dull.    Mouth/Throat:     Pharynx: Posterior oropharyngeal erythema (Mild) present.     Comments: Pharynx erythematous and slightly injected.  Pulmonary:     Effort: Pulmonary effort is  normal.  Skin:    General: Skin is warm and dry.  Neurological:     Mental Status: She is alert and oriented to person, place, and time. Mental status is at baseline.  Psychiatric:        Mood and Affect: Mood normal.        Behavior: Behavior normal.        Thought Content: Thought content normal.        Judgment: Judgment normal.       Results:   Studies obtained and personally reviewed by me:    Labs:       Component Value Date/Time   NA 137 06/19/2022 0938   K 4.9 06/19/2022 0938   CL 101 06/19/2022 0938   CO2 26 06/19/2022 0938   GLUCOSE 94 06/19/2022 0938   BUN 19 06/19/2022 0938   CREATININE 0.84 06/19/2022 0938   CALCIUM 10.4 06/19/2022 0938   PROT 6.9 06/19/2022 0938   ALBUMIN 4.3 05/12/2016 1027   AST 18 06/19/2022 0938   ALT 18 06/19/2022 0938   ALKPHOS 84 05/12/2016 1027   BILITOT 0.8 06/19/2022 0938   GFRNONAA 54 (L) 06/15/2020 1138   GFRAA 62 06/15/2020 1138     Lab Results  Component Value Date   WBC 7.0 06/19/2022   HGB 14.3 06/19/2022   HCT  44.2 06/19/2022   MCV 92.7 06/19/2022   PLT 280 06/19/2022    Lab Results  Component Value Date   CHOL 193 06/19/2022   HDL 58 06/19/2022   LDLCALC 109 (H) 06/19/2022   TRIG 145 06/19/2022   CHOLHDL 3.3 06/19/2022    Lab Results  Component Value Date   HGBA1C 5.6 06/19/2022     Lab Results  Component Value Date   TSH 1.60 06/19/2022      Assessment & Plan:   Bilateral Serous Otitis Media, Maxillary Sinusitis: Administered Rocephin 1g, Depo 80 mg injections. Prescribed Hydrocodone-Apap 5/325 mg once every 8 hours with food as needed for headache.  She has tried this regimen before and it was helpful.  I,Alexander Ruley,acting as a Education administrator for Elby Showers, MD.,have documented all relevant documentation on the behalf of Elby Showers, MD,as directed by  Elby Showers, MD while in the presence of Elby Showers, MD.

## 2022-10-02 NOTE — Telephone Encounter (Signed)
COVID test is negative, scheduled for 12:00

## 2022-10-09 NOTE — Patient Instructions (Addendum)
Given Rocephin 1 g IM Depo-Medrol 80 mg IM.  May take hydrocodone APAP 5/325 every 8 hours with food as needed for headache.  She has a Z-Pak at home should she need it.  She will call if not improving.  She has tried this regimen before with injections and if was helpful.

## 2022-10-12 ENCOUNTER — Other Ambulatory Visit: Payer: Self-pay | Admitting: Internal Medicine

## 2022-10-12 MED ORDER — VENLAFAXINE HCL ER 75 MG PO CP24: 75.0000 mg | ORAL_CAPSULE | Freq: Every day | ORAL | 0 refills | Status: DC

## 2022-11-04 ENCOUNTER — Other Ambulatory Visit: Payer: Self-pay | Admitting: Internal Medicine

## 2022-11-18 ENCOUNTER — Other Ambulatory Visit: Payer: Self-pay | Admitting: Internal Medicine

## 2022-12-14 ENCOUNTER — Other Ambulatory Visit: Payer: Medicare HMO

## 2022-12-14 DIAGNOSIS — E782 Mixed hyperlipidemia: Secondary | ICD-10-CM | POA: Diagnosis not present

## 2022-12-14 DIAGNOSIS — R7302 Impaired glucose tolerance (oral): Secondary | ICD-10-CM | POA: Diagnosis not present

## 2022-12-14 NOTE — Progress Notes (Signed)
Patient Care Team: Margaree Mackintosh, MD as PCP - General (Internal Medicine)  Visit Date: 12/21/22  Subjective:    Patient ID: Stephanie Hernandez , Female   DOB: 11/29/1950, 72 y.o.    MRN: [id number]   72 y.o. Female presents today for a 6 month follow-up.  History of anxiety treated with alprazolam 0.5 mg three times daily as needed, venlafaxine XR 75 mg daily.  History of hypertension treated with losartan 25 mg daily. Blood pressure normal today at 128/76.  History of palpitations treated with pindolol 5 mg daily.  History of Vitamin D deficiency treated with Vitamin D 2,000 units daily at bedtime.  Liver, kidney function normal. Lipid panel normal. HGBA1c at 5.6%.  Past Medical History:  Diagnosis Date   Hypertension      Family History  Problem Relation Age of Onset   Heart disease Mother    Heart disease Father     Social History   Social History Narrative   Not on file      Review of Systems  Constitutional:  Negative for fever and malaise/fatigue.  HENT:  Negative for congestion.   Eyes:  Negative for blurred vision.  Respiratory:  Negative for cough and shortness of breath.   Cardiovascular:  Negative for chest pain, palpitations and leg swelling.  Gastrointestinal:  Negative for vomiting.  Musculoskeletal:  Negative for back pain.  Skin:  Negative for rash.  Neurological:  Negative for loss of consciousness and headaches.        Objective:   Vitals: BP 128/76   Pulse 82   Temp 97.9 F (36.6 C) (Tympanic)   Ht 5\' 1"  (1.549 m)   Wt 195 lb 12.8 oz (88.8 kg)   SpO2 96%   BMI 37.00 kg/m    Physical Exam Vitals and nursing note reviewed.  Constitutional:      General: She is not in acute distress.    Appearance: Normal appearance. She is not toxic-appearing.  HENT:     Head: Normocephalic and atraumatic.  Cardiovascular:     Rate and Rhythm: Normal rate and regular rhythm. No extrasystoles are present.    Pulses: Normal pulses.     Heart  sounds: Normal heart sounds. No murmur heard.    No friction rub. No gallop.  Pulmonary:     Effort: Pulmonary effort is normal. No respiratory distress.     Breath sounds: Normal breath sounds. No wheezing or rales.  Skin:    General: Skin is warm and dry.  Neurological:     Mental Status: She is alert and oriented to person, place, and time. Mental status is at baseline.  Psychiatric:        Mood and Affect: Mood normal.        Behavior: Behavior normal.        Thought Content: Thought content normal.        Judgment: Judgment normal.       Results:   Studies obtained and personally reviewed by me:   Labs:       Component Value Date/Time   NA 137 06/19/2022 0938   K 4.9 06/19/2022 0938   CL 101 06/19/2022 0938   CO2 26 06/19/2022 0938   GLUCOSE 94 06/19/2022 0938   BUN 19 06/19/2022 0938   CREATININE 0.84 06/19/2022 0938   CALCIUM 10.4 06/19/2022 0938   PROT 6.5 12/14/2022 1144   ALBUMIN 4.3 05/12/2016 1027   AST 23 12/14/2022 1144   ALT 21  12/14/2022 1144   ALKPHOS 84 05/12/2016 1027   BILITOT 0.7 12/14/2022 1144   GFRNONAA 54 (L) 06/15/2020 1138   GFRAA 62 06/15/2020 1138     Lab Results  Component Value Date   WBC 7.0 06/19/2022   HGB 14.3 06/19/2022   HCT 44.2 06/19/2022   MCV 92.7 06/19/2022   PLT 280 06/19/2022    Lab Results  Component Value Date   CHOL 173 12/14/2022   HDL 56 12/14/2022   LDLCALC 96 12/14/2022   TRIG 109 12/14/2022   CHOLHDL 3.1 12/14/2022    Lab Results  Component Value Date   HGBA1C 5.6 12/14/2022     Lab Results  Component Value Date   TSH 1.60 06/19/2022      Assessment & Plan:   Anxiety: treated with alprazolam 0.5 mg three times daily as needed, venlafaxine XR 75 mg daily. Refilled.   Hypertension: treated with losartan 25 mg daily. Blood pressure normal today at 128/76.  Palpitations: treated with pindolol 5 mg daily.  Vitamin D deficiency: treated with Vitamin D 2,000 units daily at bedtime.  Vaccine  counseling: UTD on shingles, flu, pneumonia. She will go to the pharmacy for the RSV vaccine.  Return in 6 months for health maintenance exam or as needed.    I,Alexander Ruley,acting as a Neurosurgeon for Margaree Mackintosh, MD.,have documented all relevant documentation on the behalf of Margaree Mackintosh, MD,as directed by  Margaree Mackintosh, MD while in the presence of Margaree Mackintosh, MD.   I, Margaree Mackintosh, MD, have reviewed all documentation for this visit. The documentation on 12/21/22 for the exam, diagnosis, procedures, and orders are all accurate and complete.

## 2022-12-15 LAB — LIPID PANEL
Cholesterol: 173 mg/dL (ref <200)
HDL: 56 mg/dL (ref >=50)
LDL Cholesterol (Calc): 96 mg/dL (calc)
Non-HDL Cholesterol (Calc): 117 mg/dL (calc) (ref <130)
Total CHOL/HDL Ratio: 3.1 (calc) (ref <5.0)
Triglycerides: 109 mg/dL (ref <150)

## 2022-12-15 LAB — HEPATIC FUNCTION PANEL
AG Ratio: 2.4 (calc) (ref 1.0–2.5)
ALT: 21 U/L (ref 6–29)
AST: 23 U/L (ref 10–35)
Albumin: 4.6 g/dL (ref 3.6–5.1)
Alkaline phosphatase (APISO): 96 U/L (ref 37–153)
Bilirubin, Direct: 0.1 mg/dL (ref 0.0–0.2)
Globulin: 1.9 g/dL (calc) (ref 1.9–3.7)
Indirect Bilirubin: 0.6 mg/dL (calc) (ref 0.2–1.2)
Total Bilirubin: 0.7 mg/dL (ref 0.2–1.2)
Total Protein: 6.5 g/dL (ref 6.1–8.1)

## 2022-12-15 LAB — HEMOGLOBIN A1C
Hgb A1c MFr Bld: 5.6 % of total Hgb (ref <5.7)
Mean Plasma Glucose: 114 mg/dL
eAG (mmol/L): 6.3 mmol/L

## 2022-12-15 LAB — MICROALBUMIN / CREATININE URINE RATIO
Creatinine, Urine: 44 mg/dL (ref 20–275)
Microalb Creat Ratio: 5 mg/g creat (ref <30)
Microalb, Ur: 0.2 mg/dL

## 2022-12-21 ENCOUNTER — Ambulatory Visit (INDEPENDENT_AMBULATORY_CARE_PROVIDER_SITE_OTHER): Payer: Medicare HMO | Admitting: Internal Medicine

## 2022-12-21 ENCOUNTER — Encounter: Payer: Self-pay | Admitting: Internal Medicine

## 2022-12-21 VITALS — BP 128/76 | HR 82 | Temp 97.9°F | Ht 61.0 in | Wt 195.8 lb

## 2022-12-21 DIAGNOSIS — I10 Essential (primary) hypertension: Secondary | ICD-10-CM

## 2022-12-21 DIAGNOSIS — Z8659 Personal history of other mental and behavioral disorders: Secondary | ICD-10-CM

## 2022-12-21 DIAGNOSIS — Z87898 Personal history of other specified conditions: Secondary | ICD-10-CM | POA: Diagnosis not present

## 2022-12-21 MED ORDER — ALPRAZOLAM 0.5 MG PO TABS: 0.5000 mg | ORAL_TABLET | Freq: Two times a day (BID) | ORAL | 1 refills | Status: DC | PRN

## 2022-12-21 NOTE — Patient Instructions (Signed)
Labs are excellent. BP well controlled. RTC in 6 months. Xanax refilled. It was a pleasure to see you today.

## 2023-02-08 DIAGNOSIS — Z0100 Encounter for examination of eyes and vision without abnormal findings: Secondary | ICD-10-CM | POA: Diagnosis not present

## 2023-02-08 DIAGNOSIS — H524 Presbyopia: Secondary | ICD-10-CM | POA: Diagnosis not present

## 2023-03-02 ENCOUNTER — Other Ambulatory Visit: Payer: Self-pay | Admitting: Internal Medicine

## 2023-03-21 IMAGING — MG MM DIGITAL SCREENING BILAT W/ TOMO AND CAD
8 series · 8 of 24 positions shown · non-contrast
Comparison: Previous exam(s).

CLINICAL DATA: Screening.

EXAM:
DIGITAL SCREENING BILATERAL MAMMOGRAM WITH TOMOSYNTHESIS AND CAD
TECHNIQUE: Bilateral screening digital craniocaudal and mediolateral oblique
mammograms were obtained. Bilateral screening digital breast
tomosynthesis was performed. The images were evaluated with
computer-aided detection.

[R MLO synth-2D]
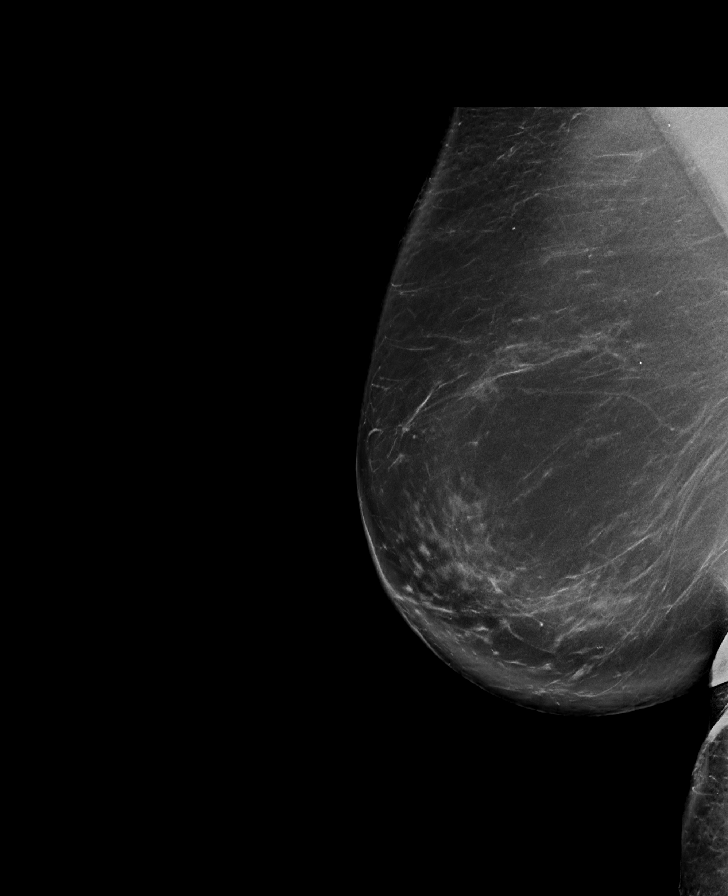

[L MLO synth-2D]
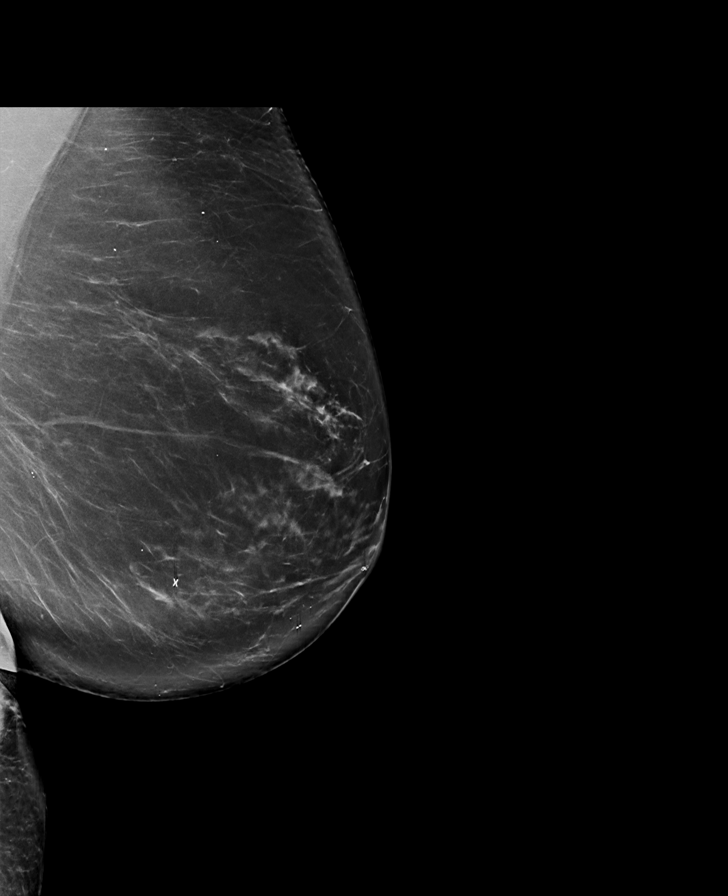

[L CC synth-2D]
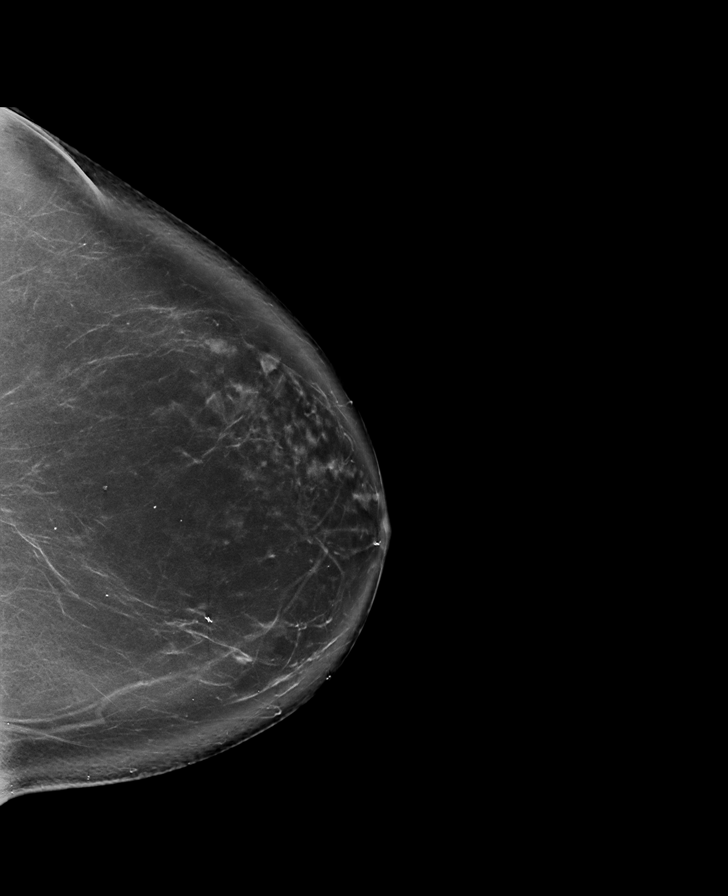

[R CC synth-2D]
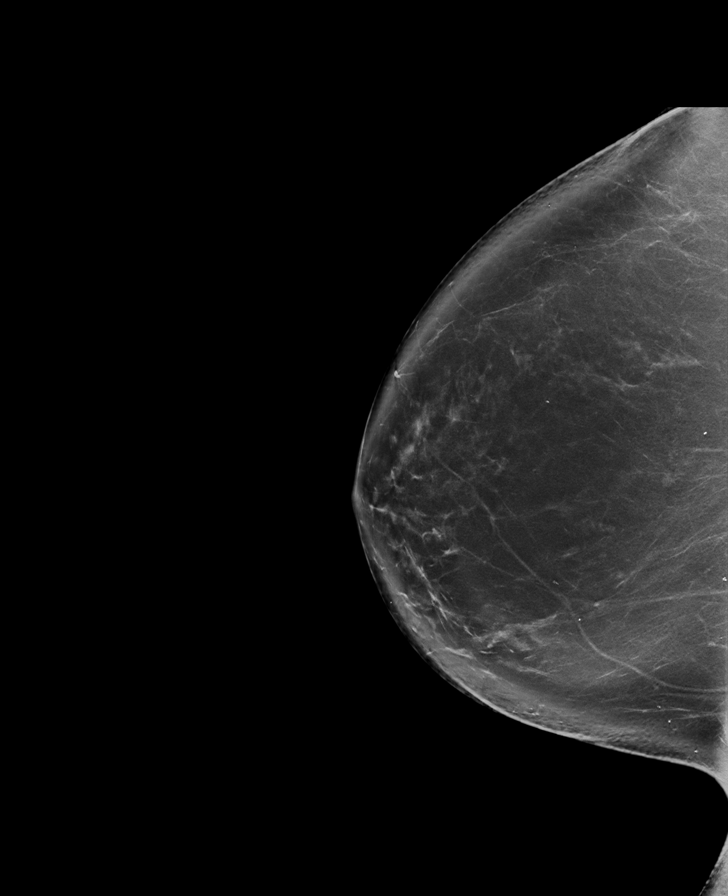

[R CC tomo · tomo slice 50/99.0]
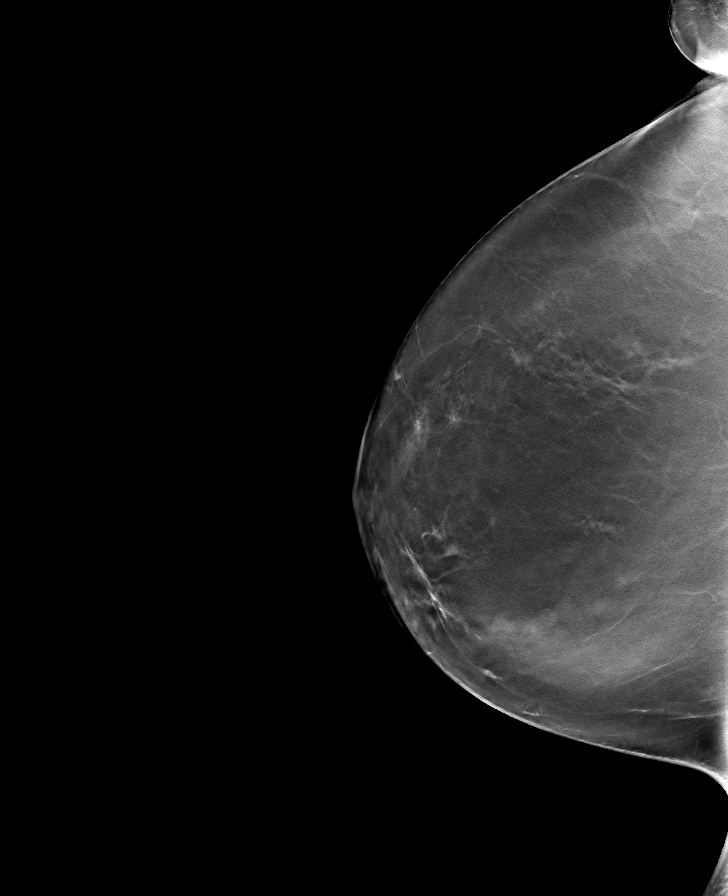

[R MLO tomo · tomo slice 49/96.0]
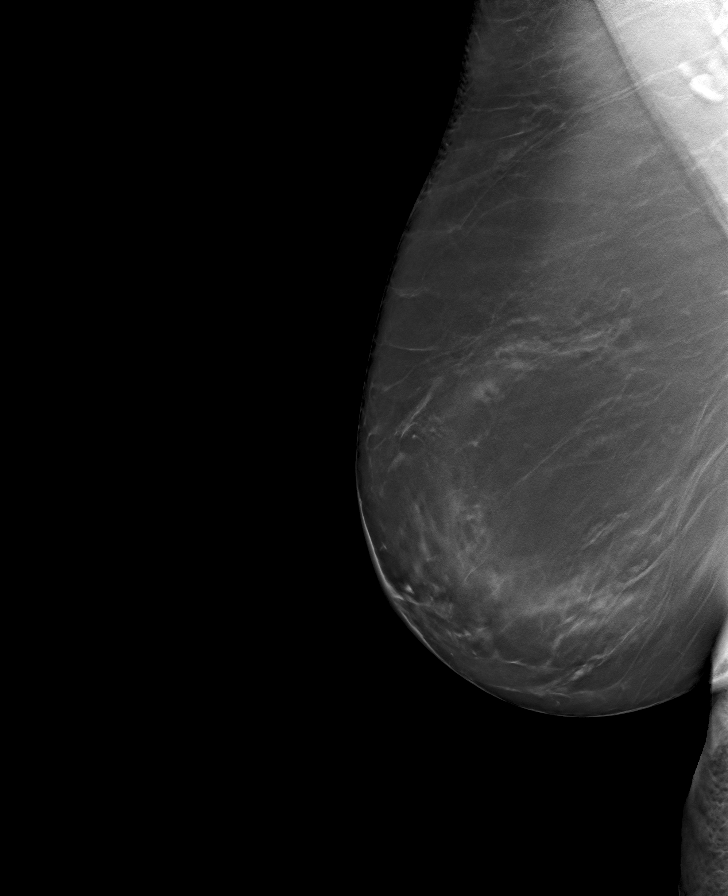

[L MLO tomo · tomo slice 45/89.0]
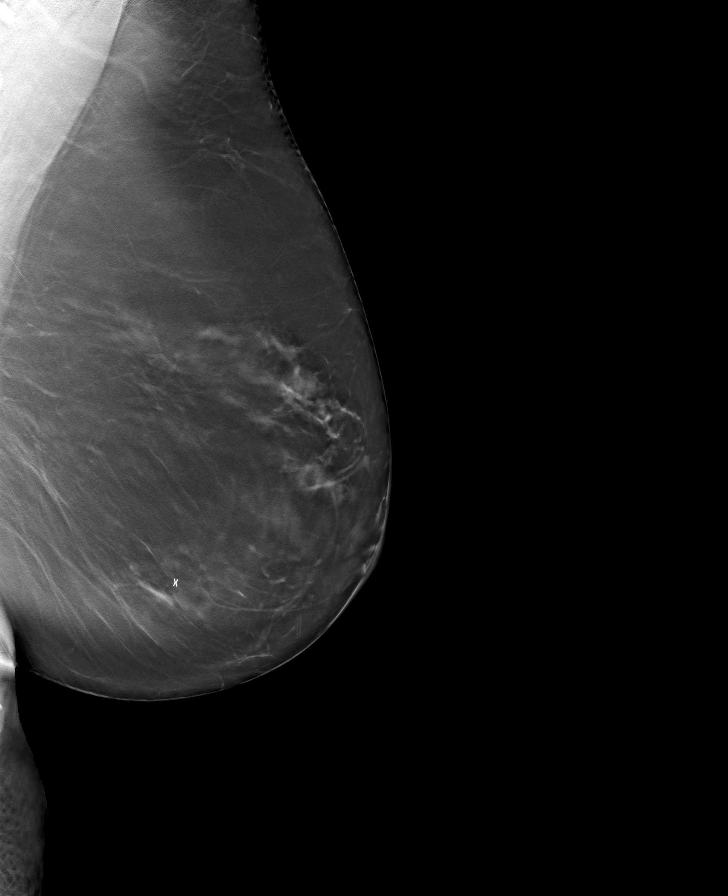

[L CC tomo · tomo slice 48/95.0]
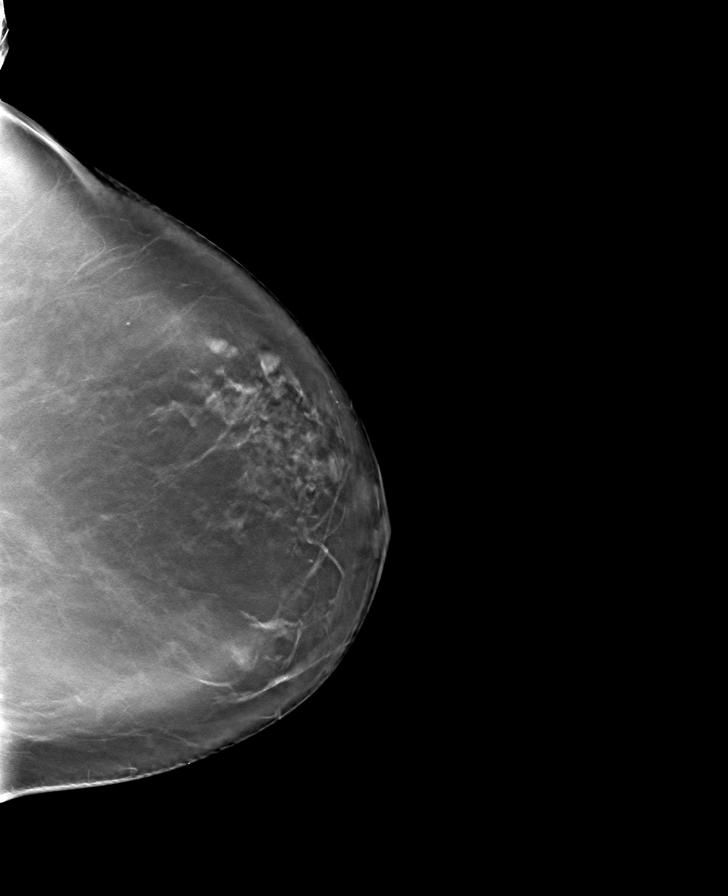

[8 of 24 positions shown; findings below may reference images not displayed]

ACR Breast Density Category b: There are scattered areas of
fibroglandular density.
FINDINGS: There are no findings suspicious for malignancy.
IMPRESSION: No mammographic evidence of malignancy. A result letter of this
screening mammogram will be mailed directly to the patient.

RECOMMENDATION:
Screening mammogram in one year. (Code:51-O-LD2)

BI-RADS CATEGORY  1: Negative.

## 2023-04-03 ENCOUNTER — Other Ambulatory Visit: Payer: Self-pay | Admitting: Internal Medicine

## 2023-04-03 DIAGNOSIS — Z1231 Encounter for screening mammogram for malignant neoplasm of breast: Secondary | ICD-10-CM

## 2023-05-10 ENCOUNTER — Other Ambulatory Visit: Payer: Self-pay | Admitting: Internal Medicine

## 2023-05-10 NOTE — Telephone Encounter (Signed)
Last refill 03/02/23 # 30 + 1 refill

## 2023-05-16 DIAGNOSIS — F325 Major depressive disorder, single episode, in full remission: Secondary | ICD-10-CM | POA: Diagnosis not present

## 2023-05-16 DIAGNOSIS — F411 Generalized anxiety disorder: Secondary | ICD-10-CM | POA: Diagnosis not present

## 2023-05-16 DIAGNOSIS — Z882 Allergy status to sulfonamides status: Secondary | ICD-10-CM | POA: Diagnosis not present

## 2023-05-16 DIAGNOSIS — Z6833 Body mass index (BMI) 33.0-33.9, adult: Secondary | ICD-10-CM | POA: Diagnosis not present

## 2023-05-16 DIAGNOSIS — I10 Essential (primary) hypertension: Secondary | ICD-10-CM | POA: Diagnosis not present

## 2023-05-16 DIAGNOSIS — Z8249 Family history of ischemic heart disease and other diseases of the circulatory system: Secondary | ICD-10-CM | POA: Diagnosis not present

## 2023-05-16 DIAGNOSIS — E669 Obesity, unspecified: Secondary | ICD-10-CM | POA: Diagnosis not present

## 2023-05-24 ENCOUNTER — Ambulatory Visit
Admission: RE | Admit: 2023-05-24 | Discharge: 2023-05-24 | Disposition: A | Discharge status: Home / Self Care | Payer: Medicare HMO | Source: Ambulatory Visit | Attending: Internal Medicine | Admitting: Internal Medicine

## 2023-05-24 DIAGNOSIS — Z1231 Encounter for screening mammogram for malignant neoplasm of breast: Secondary | ICD-10-CM

## 2023-06-13 ENCOUNTER — Other Ambulatory Visit: Payer: Self-pay | Admitting: Internal Medicine

## 2023-06-21 ENCOUNTER — Other Ambulatory Visit: Payer: Medicare HMO

## 2023-06-21 DIAGNOSIS — Z0000 Encounter for general adult medical examination without abnormal findings: Secondary | ICD-10-CM

## 2023-06-21 DIAGNOSIS — I10 Essential (primary) hypertension: Secondary | ICD-10-CM

## 2023-06-21 DIAGNOSIS — E782 Mixed hyperlipidemia: Secondary | ICD-10-CM

## 2023-06-21 DIAGNOSIS — R7302 Impaired glucose tolerance (oral): Secondary | ICD-10-CM

## 2023-06-21 DIAGNOSIS — Z87898 Personal history of other specified conditions: Secondary | ICD-10-CM

## 2023-06-21 NOTE — Progress Notes (Addendum)
Annual Wellness Visit    Patient Care Team: Margaree Mackintosh, MD as PCP - General (Internal Medicine)  Visit Date: 06/28/23   Chief Complaint  Patient presents with   Medicare Wellness   Annual Exam    Subjective:   Patient: Stephanie Hernandez, Female    DOB: Mar 03, 1951, 72 y.o.   MRN: [id number]  Stephanie Hernandez is a 72 y.o. Female who presents today for her Annual Wellness Visit. History of hypertension.   History of hypertension treated with losartan 25 mg daily. Blood pressure normal in-office today at 130/70.  History of palpitations treated with pindolol 5-10 mg daily.  History of anxiety and depression treated with alprazolam 0.5 mg twice daily as needed, venlafaxine XR 75 mg daily.   Had Herpes zoster 2018 treated with Valtrex.   Past medical history: Fractured fifth metatarsal 2004.  Became menopausal around age 89.  Tetanus immunization April 2021.  No history of operations except for benign left breast biopsy December 2015.  Labs reviewed today. Calcium elevated at 10.5. Glucose normal. Kidney, liver functions normal. Blood proteins normal. CBC normal. Lipid panel normal. TSH at 1.61.  Mammogram normal on 05/28/23.  Had colonoscopy in [place] August 2018 with 10-year follow-up recommended.  Social history: Single.  Never married.  Completed 1 year of college.  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  Does not smoke or consume alcohol.  Resides alone.  No children.  Currently working at 2 different businesses in [place] and is enjoying her work.    Family history: Mother died at age 34 of respiratory failure. Father died at age 40 suddenly. Maternal uncle with history of colon cancer in his 48s. Paternal grandmother with history of uterine cancer.   Past Medical History:  Diagnosis Date   Hypertension      Family History  Problem Relation Age of Onset   Heart disease Mother    Heart disease Father          Review of Systems  Constitutional:  Negative for  chills, fever, malaise/fatigue and weight loss.  HENT:  Negative for hearing loss, sinus pain and sore throat.   Respiratory:  Negative for cough, hemoptysis and shortness of breath.   Cardiovascular:  Negative for chest pain, palpitations, leg swelling and PND.  Gastrointestinal:  Negative for abdominal pain, constipation, diarrhea, heartburn, nausea and vomiting.  Genitourinary:  Negative for dysuria, frequency and urgency.  Musculoskeletal:  Negative for back pain, myalgias and neck pain.  Skin:  Negative for itching and rash.  Neurological:  Negative for dizziness, tingling, seizures and headaches.  Endo/Heme/Allergies:  Negative for polydipsia.  Psychiatric/Behavioral:  Negative for depression. The patient is not nervous/anxious.       Objective:   Vitals: BP 130/70   Pulse 82   Ht 5\' 1"  (1.549 m)   Wt 198 lb (89.8 kg)   SpO2 96%   BMI 37.41 kg/m   Physical Exam Vitals and nursing note reviewed.  Constitutional:      General: She is not in acute distress.    Appearance: Normal appearance. She is not ill-appearing or toxic-appearing.  HENT:     Head: Normocephalic and atraumatic.     Right Ear: Hearing, tympanic membrane, ear canal and external ear normal.     Left Ear: Hearing, tympanic membrane, ear canal and external ear normal.     Mouth/Throat:     Pharynx: Oropharynx is clear.  Eyes:     Extraocular Movements:  Extraocular movements intact.     Pupils: Pupils are equal, round, and reactive to light.  Neck:     Thyroid: No thyroid mass, thyromegaly or thyroid tenderness.     Vascular: No carotid bruit.  Cardiovascular:     Rate and Rhythm: Normal rate and regular rhythm. No extrasystoles are present.    Heart sounds: Normal heart sounds. No murmur heard.    No friction rub. No gallop.  Pulmonary:     Effort: Pulmonary effort is normal.     Breath sounds: Normal breath sounds. No decreased breath sounds, wheezing, rhonchi or rales.  Chest:     Chest wall: No  mass.  Abdominal:     Palpations: Abdomen is soft. There is no hepatomegaly, splenomegaly or mass.     Tenderness: There is no abdominal tenderness.     Hernia: No hernia is present.  Musculoskeletal:     Cervical back: Normal range of motion.     Right lower leg: No edema.     Left lower leg: No edema.  Lymphadenopathy:     Cervical: No cervical adenopathy.     Upper Body:     Right upper body: No supraclavicular adenopathy.     Left upper body: No supraclavicular adenopathy.  Skin:    General: Skin is warm and dry.  Neurological:     General: No focal deficit present.     Mental Status: She is alert and oriented to person, place, and time. Mental status is at baseline.     Sensory: Sensation is intact.     Motor: Motor function is intact. No weakness.     Deep Tendon Reflexes: Reflexes are normal and symmetric.  Psychiatric:        Attention and Perception: Attention normal.        Mood and Affect: Mood normal.        Speech: Speech normal.        Behavior: Behavior normal.        Thought Content: Thought content normal.        Cognition and Memory: Cognition normal.        Judgment: Judgment normal.     Most recent functional status assessment:    06/28/2023    3:00 PM  In your present state of health, do you have any difficulty performing the following activities:  Hearing? 0  Vision? 0  Difficulty concentrating or making decisions? 0  Walking or climbing stairs? 0  Dressing or bathing? 0  Doing errands, shopping? 0  Preparing Food and eating ? N  Using the Toilet? N  In the past six months, have you accidently leaked urine? N  Do you have problems with loss of bowel control? N  Managing your Medications? N  Managing your Finances? N  Housekeeping or managing your Housekeeping? N   Most recent fall risk assessment:    06/28/2023    3:08 PM  Fall Risk   Falls in the past year? 0  Number falls in past yr: 0  Injury with Fall? 1  Risk for fall due to : No  Fall Risks  Follow up Falls evaluation completed;Education provided    Most recent depression screenings:    12/21/2022    2:14 PM 10/02/2022   12:05 PM  PHQ 2/9 Scores  PHQ - 2 Score 0 0   Most recent cognitive screening:    06/28/2023    3:09 PM  6CIT Screen  What Year? 0 points  What month?  0 points  What time? 0 points  Count back from 20 0 points  Months in reverse 0 points  Repeat phrase 0 points  Total Score 0 points     Results:   Studies obtained and personally reviewed by me:  Mammogram normal on 05/28/23.  Had colonoscopy in [place] August 2018 with 10-year follow-up recommended.  Labs:       Component Value Date/Time   NA 139 06/21/2023 1121   K 4.9 06/21/2023 1121   CL 102 06/21/2023 1121   CO2 28 06/21/2023 1121   GLUCOSE 97 06/21/2023 1121   BUN 22 06/21/2023 1121   CREATININE 0.88 06/21/2023 1121   CALCIUM 10.5 (H) 06/21/2023 1121   PROT 6.7 06/21/2023 1121   ALBUMIN 4.3 05/12/2016 1027   AST 20 06/21/2023 1121   ALT 20 06/21/2023 1121   ALKPHOS 84 05/12/2016 1027   BILITOT 0.7 06/21/2023 1121   GFRNONAA 54 (L) 06/15/2020 1138   GFRAA 62 06/15/2020 1138     Lab Results  Component Value Date   WBC 7.6 06/21/2023   HGB 13.4 06/21/2023   HCT 41.8 06/21/2023   MCV 93.1 06/21/2023   PLT 306 06/21/2023    Lab Results  Component Value Date   CHOL 168 06/21/2023   HDL 52 06/21/2023   LDLCALC 94 06/21/2023   TRIG 128 06/21/2023   CHOLHDL 3.2 06/21/2023    Lab Results  Component Value Date   HGBA1C 5.6 12/14/2022     Lab Results  Component Value Date   TSH 1.61 06/21/2023    Assessment & Plan:   Hypertension: treated with losartan 25 mg daily. Blood pressure normal in-office today at 130/70.  Palpitations: treated with pindolol 5-10 mg daily.  Anxiety and depression: stable with alprazolam 0.5 mg twice daily as needed, venlafaxine XR 75 mg daily. Refilled venlafaxine.  Calcium elevated at 10.5. Ordered calcium test to  recheck.Repeat calcium was normal at 10.2. Follow up at next CPE.   Had herpes zoster 2018 treated with Valtrex.  Urinalysis normal today.  Mammogram normal on 05/28/23.  Had colonoscopy in [place] August 2018 with 10-year follow-up recommended.  Vaccine counseling: UTD on shingles, tetanus, pneumococcal vaccines.  Return in 6 months for OV  or as needed.     Annual wellness visit done today including the all of the following: Reviewed patient's Family Medical History Reviewed and updated list of patient's medical providers Assessment of cognitive impairment was done Assessed patient's functional ability Established a written schedule for health screening services Health Risk Assessent Completed and Reviewed  Discussed health benefits of physical activity, and encouraged her to engage in regular exercise appropriate for her age and condition.        I,Alexander Ruley,acting as a Neurosurgeon for Margaree Mackintosh, MD.,have documented all relevant documentation on the behalf of Margaree Mackintosh, MD,as directed by  Margaree Mackintosh, MD while in the presence of Margaree Mackintosh, MD.   I, Margaree Mackintosh, MD, have reviewed all documentation for this visit. The documentation on 07/01/23 for the exam, diagnosis, procedures, and orders are all accurate and complete.

## 2023-06-22 LAB — CBC WITH DIFFERENTIAL/PLATELET
Absolute Lymphocytes: 1991 {cells}/uL (ref 850–3900)
Absolute Monocytes: 464 {cells}/uL (ref 200–950)
Basophils Absolute: 23 {cells}/uL (ref 0–200)
Basophils Relative: 0.3 %
Eosinophils Absolute: 122 {cells}/uL (ref 15–500)
Eosinophils Relative: 1.6 %
HCT: 41.8 % (ref 35.0–45.0)
Hemoglobin: 13.4 g/dL (ref 11.7–15.5)
MCH: 29.8 pg (ref 27.0–33.0)
MCHC: 32.1 g/dL (ref 32.0–36.0)
MCV: 93.1 fL (ref 80.0–100.0)
MPV: 12.4 fL (ref 7.5–12.5)
Monocytes Relative: 6.1 %
Neutro Abs: 5001 {cells}/uL (ref 1500–7800)
Neutrophils Relative %: 65.8 %
Platelets: 306 10*3/uL (ref 140–400)
RBC: 4.49 10*6/uL (ref 3.80–5.10)
RDW: 13.1 % (ref 11.0–15.0)
Total Lymphocyte: 26.2 %
WBC: 7.6 10*3/uL (ref 3.8–10.8)

## 2023-06-22 LAB — LIPID PANEL
Cholesterol: 168 mg/dL (ref <200)
HDL: 52 mg/dL (ref >=50)
LDL Cholesterol (Calc): 94 mg/dL
Non-HDL Cholesterol (Calc): 116 mg/dL (ref <130)
Total CHOL/HDL Ratio: 3.2 (calc) (ref <5.0)
Triglycerides: 128 mg/dL (ref <150)

## 2023-06-22 LAB — COMPLETE METABOLIC PANEL WITH GFR
AG Ratio: 2.2 (calc) (ref 1.0–2.5)
ALT: 20 U/L (ref 6–29)
AST: 20 U/L (ref 10–35)
Albumin: 4.6 g/dL (ref 3.6–5.1)
Alkaline phosphatase (APISO): 100 U/L (ref 37–153)
BUN: 22 mg/dL (ref 7–25)
CO2: 28 mmol/L (ref 20–32)
Calcium: 10.5 mg/dL — ABNORMAL HIGH (ref 8.6–10.4)
Chloride: 102 mmol/L (ref 98–110)
Creat: 0.88 mg/dL (ref 0.60–1.00)
Globulin: 2.1 g/dL (ref 1.9–3.7)
Glucose, Bld: 97 mg/dL (ref 65–99)
Potassium: 4.9 mmol/L (ref 3.5–5.3)
Sodium: 139 mmol/L (ref 135–146)
Total Bilirubin: 0.7 mg/dL (ref 0.2–1.2)
Total Protein: 6.7 g/dL (ref 6.1–8.1)
eGFR: 71 mL/min/{1.73_m2} (ref >=60)

## 2023-06-22 LAB — TSH: TSH: 1.61 m[IU]/L (ref 0.40–4.50)

## 2023-06-28 ENCOUNTER — Ambulatory Visit: Payer: Medicare HMO | Admitting: Internal Medicine

## 2023-06-28 ENCOUNTER — Encounter: Payer: Self-pay | Admitting: Internal Medicine

## 2023-06-28 VITALS — BP 130/70 | HR 82 | Ht 61.0 in | Wt 198.0 lb

## 2023-06-28 DIAGNOSIS — F419 Anxiety disorder, unspecified: Secondary | ICD-10-CM

## 2023-06-28 DIAGNOSIS — I10 Essential (primary) hypertension: Secondary | ICD-10-CM | POA: Diagnosis not present

## 2023-06-28 DIAGNOSIS — Z0000 Encounter for general adult medical examination without abnormal findings: Secondary | ICD-10-CM

## 2023-06-28 DIAGNOSIS — Z87898 Personal history of other specified conditions: Secondary | ICD-10-CM | POA: Diagnosis not present

## 2023-06-28 DIAGNOSIS — F32A Depression, unspecified: Secondary | ICD-10-CM

## 2023-06-28 LAB — POCT URINALYSIS DIP (CLINITEK)
Bilirubin, UA: NEGATIVE
Blood, UA: NEGATIVE
Glucose, UA: NEGATIVE mg/dL
Ketones, POC UA: NEGATIVE mg/dL
Leukocytes, UA: NEGATIVE
Nitrite, UA: NEGATIVE
POC PROTEIN,UA: NEGATIVE
Spec Grav, UA: 1.01 (ref 1.010–1.025)
Urobilinogen, UA: 0.2 U/dL
pH, UA: 7 (ref 5.0–8.0)

## 2023-06-28 MED ORDER — VENLAFAXINE HCL ER 37.5 MG PO CP24: 37.5000 mg | ORAL_CAPSULE | Freq: Every day | ORAL | 0 refills | Status: DC

## 2023-06-29 LAB — CALCIUM: Calcium: 10.2 mg/dL (ref 8.6–10.4)

## 2023-07-14 NOTE — Patient Instructions (Addendum)
It was a pleasure to see you today.  Serum calcium was repeated and proved to be within normal limits.  Labs are stable.  Blood pressure is under excellent control.  Palpitations are controlled and anxiety and depression are under good control.  Please return in 6 months.

## 2023-07-21 ENCOUNTER — Other Ambulatory Visit: Payer: Self-pay | Admitting: Internal Medicine

## 2023-07-23 NOTE — Telephone Encounter (Signed)
Pharmacy is requesting 90 days. Last refill 11/07 #30

## 2023-09-05 DIAGNOSIS — L538 Other specified erythematous conditions: Secondary | ICD-10-CM | POA: Diagnosis not present

## 2023-09-05 DIAGNOSIS — D2239 Melanocytic nevi of other parts of face: Secondary | ICD-10-CM | POA: Diagnosis not present

## 2023-09-05 DIAGNOSIS — D220 Melanocytic nevi of lip: Secondary | ICD-10-CM | POA: Diagnosis not present

## 2023-09-05 DIAGNOSIS — L820 Inflamed seborrheic keratosis: Secondary | ICD-10-CM | POA: Diagnosis not present

## 2023-09-05 DIAGNOSIS — T2016XA Burn of first degree of forehead and cheek, initial encounter: Secondary | ICD-10-CM | POA: Diagnosis not present

## 2023-09-20 ENCOUNTER — Other Ambulatory Visit: Payer: Self-pay | Admitting: Internal Medicine

## 2023-11-14 ENCOUNTER — Other Ambulatory Visit: Payer: Self-pay | Admitting: Internal Medicine

## 2023-11-19 ENCOUNTER — Ambulatory Visit (INDEPENDENT_AMBULATORY_CARE_PROVIDER_SITE_OTHER): Admitting: Internal Medicine

## 2023-11-19 ENCOUNTER — Encounter: Payer: Self-pay | Admitting: Internal Medicine

## 2023-11-19 VITALS — BP 120/74 | HR 84 | Ht 61.0 in | Wt 202.8 lb

## 2023-11-19 DIAGNOSIS — R350 Frequency of micturition: Secondary | ICD-10-CM

## 2023-11-19 DIAGNOSIS — R319 Hematuria, unspecified: Secondary | ICD-10-CM

## 2023-11-19 LAB — POCT URINALYSIS DIPSTICK
Bilirubin, UA: NEGATIVE
Glucose, UA: NEGATIVE
Ketones, UA: NEGATIVE
Leukocytes, UA: NEGATIVE
Nitrite, UA: NEGATIVE
Odor: NORMAL
Protein, UA: NEGATIVE
Spec Grav, UA: 1.02 (ref 1.010–1.025)
Urobilinogen, UA: 0.2 U/dL
pH, UA: 5 (ref 5.0–8.0)

## 2023-11-19 MED ORDER — CIPROFLOXACIN HCL 500 MG PO TABS: 500.0000 mg | ORAL_TABLET | Freq: Two times a day (BID) | ORAL | 0 refills | Status: DC

## 2023-11-19 NOTE — Progress Notes (Addendum)
 Patient Care Team: Margaree Mackintosh, MD as PCP - General (Internal Medicine)  Visit Date: 11/19/23  Subjective:   Chief Complaint  Patient presents with   Urinary Frequency    Pt states she has been having the urgency to void more often. No pain, burning. No visible blood. Pt states complains of slight itching in vagina. Leakage when can't make the bathroom  Patient HY:Stephanie Hernandez,Female DOB:May 30, 1951,73 y.o. QIO:[id number]   73 y.o. Female presents today for acute sick visit with Urinary Urgency/Frequency & Vaginal Pruritus. Denies dysuria/burning with urination, or hematuria. Says that she is having some incontinence if she can not make it to a bathroom quickly enough.   Past Medical History:  Diagnosis Date   Hypertension     Allergies  Allergen Reactions   Sulfa Antibiotics Rash    Family History  Problem Relation Age of Onset   Heart disease Mother    Heart disease Father    Social Hx: Single. Never married. Nonsmoker. Does not consume alcohol. Resides alone. Does clerical work for [employer]  Review of Systems  Genitourinary:  Positive for frequency and urgency. Negative for dysuria and hematuria.       (+) Vaginal Pruritus (+) Incontinence - when unable to make it to the bathroom  All other systems reviewed and are negative.    Objective:  Vitals: BP 120/74   Pulse 84   Ht 5\' 1"  (1.549 m)   Wt 202 lb 12.8 oz (92 kg)   SpO2 96%   BMI 38.32 kg/m   Physical Exam Vitals and nursing note reviewed.  Constitutional:      General: She is not in acute distress.    Appearance: Normal appearance. She is not toxic-appearing.  HENT:     Head: Normocephalic and atraumatic.  Pulmonary:     Effort: Pulmonary effort is normal.  Genitourinary:    Vagina: Normal. No vaginal discharge or bleeding.  Skin:    General: Skin is warm and dry.  Neurological:     Mental Status: She is alert and oriented to person, place, and time. Mental status is at baseline.   Psychiatric:        Mood and Affect: Mood normal.        Behavior: Behavior normal.        Thought Content: Thought content normal.        Judgment: Judgment normal.     Results:  Studies Obtained And Personally Reviewed By Me: Labs:     Component Value Date/Time   NA 139 06/21/2023 1121   K 4.9 06/21/2023 1121   CL 102 06/21/2023 1121   CO2 28 06/21/2023 1121   GLUCOSE 97 06/21/2023 1121   BUN 22 06/21/2023 1121   CREATININE 0.88 06/21/2023 1121   CALCIUM 10.2 06/28/2023 1548   PROT 6.7 06/21/2023 1121   ALBUMIN 4.3 05/12/2016 1027   AST 20 06/21/2023 1121   ALT 20 06/21/2023 1121   ALKPHOS 84 05/12/2016 1027   BILITOT 0.7 06/21/2023 1121   GFRNONAA 54 (L) 06/15/2020 1138   GFRAA 62 06/15/2020 1138    Lab Results  Component Value Date   WBC 7.6 06/21/2023   HGB 13.4 06/21/2023   HCT 41.8 06/21/2023   MCV 93.1 06/21/2023   PLT 306 06/21/2023   Lab Results  Component Value Date   CHOL 168 06/21/2023   HDL 52 06/21/2023   LDLCALC 94 06/21/2023   TRIG 128 06/21/2023   CHOLHDL 3.2 06/21/2023   Lab  Results  Component Value Date   HGBA1C 5.6 12/14/2022    Lab Results  Component Value Date   TSH 1.61 06/21/2023    Results for orders placed or performed in visit on 11/19/23  POCT urinalysis dipstick  Result Value Ref Range   Color, UA pale    Clarity, UA clear    Glucose, UA Negative Negative   Bilirubin, UA neg    Ketones, UA neg    Spec Grav, UA 1.020 1.010 - 1.025   Blood, UA large    pH, UA 5.0 5.0 - 8.0   Protein, UA Negative Negative   Urobilinogen, UA 0.2 0.2 or 1.0 E.U./dL   Nitrite, UA Neg    Leukocytes, UA Negative Negative   Appearance     Odor normal    Assessment & Plan:   Urinary Frequency: UA shows occult blood on dipstick. Culture pending. May have UTI. Sending in 500 mg Cipro - take 1 tablet (500 mg total) by mouth 2 (two) times daily for 5 days     I,Emily Lagle,acting as a scribe for Margaree Mackintosh, MD.,have documented all  relevant documentation on the behalf of Margaree Mackintosh, MD,as directed by  Margaree Mackintosh, MD while in the presence of Margaree Mackintosh, MD.   I, Margaree Mackintosh, MD, have reviewed all documentation for this visit. The documentation on 11/19/23 for the exam, diagnosis, procedures, and orders are all accurate and complete.

## 2023-11-19 NOTE — Patient Instructions (Signed)
 A urine culture has been sent. I have prescribed Cipro 500 mg twice daily for 5 days pending culture results.

## 2023-11-21 LAB — URINE CULTURE
MICRO NUMBER:: 16267731
SPECIMEN QUALITY:: ADEQUATE

## 2023-11-22 ENCOUNTER — Ambulatory Visit: Admitting: Internal Medicine

## 2023-12-19 NOTE — Progress Notes (Signed)
 Patient Care Team: Sylvan Evener, MD as PCP - General (Internal Medicine)  Visit Date: 12/27/23  Subjective:   Chief Complaint  Patient presents with   Medical Management of Chronic Issues  Patient OZ:HYQM M Ruffner,Female DOB:31-Oct-1950,73 y.o. VHQ:[id number]   73 y.o. Female presents today for 6 months follow-up for Hypertension; Palpitations; Anxiety. Patient has a past medical history of: Vitamin-D Deficiency. Seen for her annual visit on 06/28/2023 and on 11/19/2023 for an acute visit, she has also been seen at Dermatology in 08/2023.   Seen for UTI on 3/31 treated with Cipro  500 mg twice daily x5 days. She says that she is no longer having any symptoms from this.   History of Hypertension treated with Losartan  25 mg daily. Blood Pressure: normotensive today at 110/80. Palpitations treated with Pindolol  5 - 10 mg daily.   History of Anxiety treated with Xanax  0.5 mg twice daily as needed and Effexor -XR 37.5 mg daily.   History of Vitamin-D Deficiency treated with Vitamin-D 2000 units daily.  Vaccine Counseling: Covid-19 postponed. Past Medical History:  Diagnosis Date   Hypertension     Allergies  Allergen Reactions   Sulfa Antibiotics Rash    Family History  Problem Relation Age of Onset   Heart disease Mother    Heart disease Father    Social History   Social History Narrative   Not on file   Review of Systems  All other systems reviewed and are negative.    Objective:  Vitals: BP 110/80   Pulse 70   Ht 5\' 1"  (1.549 m)   Wt 200 lb (90.7 kg)   SpO2 96%   BMI 37.79 kg/m   Physical Exam Vitals and nursing note reviewed.  Constitutional:      General: She is not in acute distress.    Appearance: Normal appearance. She is not toxic-appearing.  HENT:     Head: Normocephalic and atraumatic.  Cardiovascular:     Rate and Rhythm: Normal rate and regular rhythm. No extrasystoles are present.    Pulses: Normal pulses.     Heart sounds: Normal heart sounds. No  murmur heard.    No friction rub. No gallop.  Pulmonary:     Effort: Pulmonary effort is normal. No respiratory distress.     Breath sounds: Normal breath sounds. No wheezing or rales.  Musculoskeletal:     Right lower leg: No edema.     Left lower leg: No edema.  Skin:    General: Skin is warm and dry.  Neurological:     Mental Status: She is alert and oriented to person, place, and time. Mental status is at baseline.  Psychiatric:        Mood and Affect: Mood normal.        Behavior: Behavior normal.        Thought Content: Thought content normal.        Judgment: Judgment normal.     Results:  Studies Obtained And Personally Reviewed By Me: Labs:     Component Value Date/Time   NA 137 12/20/2023 0925   K 5.0 12/20/2023 0925   CL 102 12/20/2023 0925   CO2 27 12/20/2023 0925   GLUCOSE 107 (H) 12/20/2023 0925   BUN 23 12/20/2023 0925   CREATININE 0.78 12/20/2023 0925   CALCIUM 10.0 12/20/2023 0925   PROT 6.5 12/20/2023 0925   ALBUMIN 4.3 05/12/2016 1027   AST 25 12/20/2023 0925   ALT 24 12/20/2023 0925   ALKPHOS 84  05/12/2016 1027   BILITOT 0.9 12/20/2023 0925   GFRNONAA 54 (L) 06/15/2020 1138   GFRAA 62 06/15/2020 1138    Lab Results  Component Value Date   WBC 7.1 12/20/2023   HGB 13.2 12/20/2023   HCT 40.1 12/20/2023   MCV 91.3 12/20/2023   PLT 263 12/20/2023   Lab Results  Component Value Date   CHOL 163 12/20/2023   HDL 54 12/20/2023   LDLCALC 88 12/20/2023   TRIG 110 12/20/2023   CHOLHDL 3.0 12/20/2023   Lab Results  Component Value Date   HGBA1C 5.6 12/14/2022    Lab Results  Component Value Date   TSH 1.69 12/20/2023    Assessment & Plan:  Hypertension treated with Losartan  25 mg daily. Blood Pressure: normal today at 110/80.   Palpitations treated with Pindolol  5 - 10 mg daily. Stable with no complaints  Anxiety treated with Xanax  0.5 mg twice daily as needed and Effexor -XR 37.5 mg daily.   Vitamin-D Deficiency treated with Vitamin-D  2000 units daily.  Vaccine Counseling: Covid-19 vaccine discussed- suggest getting new version in the Fall    I,Emily Lagle,acting as a scribe for Sylvan Evener, MD.,have documented all relevant documentation on the behalf of Sylvan Evener, MD,as directed by  Sylvan Evener, MD while in the presence of Sylvan Evener, MD.   I, Sylvan Evener, MD, have reviewed all documentation for this visit. The documentation on 12/27/23 for the exam, diagnosis, procedures, and orders are all accurate and complete.

## 2023-12-20 ENCOUNTER — Other Ambulatory Visit: Payer: Medicare HMO

## 2023-12-20 DIAGNOSIS — I10 Essential (primary) hypertension: Secondary | ICD-10-CM

## 2023-12-20 DIAGNOSIS — R7302 Impaired glucose tolerance (oral): Secondary | ICD-10-CM

## 2023-12-20 DIAGNOSIS — E782 Mixed hyperlipidemia: Secondary | ICD-10-CM

## 2023-12-20 DIAGNOSIS — Z0000 Encounter for general adult medical examination without abnormal findings: Secondary | ICD-10-CM

## 2023-12-20 DIAGNOSIS — Z87898 Personal history of other specified conditions: Secondary | ICD-10-CM

## 2023-12-27 ENCOUNTER — Ambulatory Visit (INDEPENDENT_AMBULATORY_CARE_PROVIDER_SITE_OTHER): Payer: Medicare HMO | Admitting: Internal Medicine

## 2023-12-27 ENCOUNTER — Encounter: Payer: Self-pay | Admitting: Internal Medicine

## 2023-12-27 VITALS — BP 110/80 | HR 70 | Ht 61.0 in | Wt 200.0 lb

## 2023-12-27 DIAGNOSIS — Z8659 Personal history of other mental and behavioral disorders: Secondary | ICD-10-CM | POA: Diagnosis not present

## 2023-12-27 DIAGNOSIS — Z87898 Personal history of other specified conditions: Secondary | ICD-10-CM

## 2023-12-27 DIAGNOSIS — I10 Essential (primary) hypertension: Secondary | ICD-10-CM

## 2023-12-27 NOTE — Patient Instructions (Signed)
 It was a pleasure to see you today. Continue same medications. Have Covid vaccine in the Fall. Return in 6 months for Medicare Wellness visit and health maintenance exam.

## 2023-12-28 LAB — TEST AUTHORIZATION

## 2023-12-28 LAB — TSH: TSH: 1.69 m[IU]/L (ref 0.40–4.50)

## 2023-12-28 LAB — COMPLETE METABOLIC PANEL WITHOUT GFR
AG Ratio: 2 (calc) (ref 1.0–2.5)
ALT: 24 U/L (ref 6–29)
AST: 25 U/L (ref 10–35)
Albumin: 4.3 g/dL (ref 3.6–5.1)
Alkaline phosphatase (APISO): 95 U/L (ref 37–153)
BUN: 23 mg/dL (ref 7–25)
CO2: 27 mmol/L (ref 20–32)
Calcium: 10 mg/dL (ref 8.6–10.4)
Chloride: 102 mmol/L (ref 98–110)
Creat: 0.78 mg/dL (ref 0.60–1.00)
Globulin: 2.2 g/dL (ref 1.9–3.7)
Glucose, Bld: 107 mg/dL — ABNORMAL HIGH (ref 65–99)
Potassium: 5 mmol/L (ref 3.5–5.3)
Sodium: 137 mmol/L (ref 135–146)
Total Bilirubin: 0.9 mg/dL (ref 0.2–1.2)
Total Protein: 6.5 g/dL (ref 6.1–8.1)

## 2023-12-28 LAB — CBC WITH DIFFERENTIAL/PLATELET
Absolute Lymphocytes: 1860 {cells}/uL (ref 850–3900)
Absolute Monocytes: 540 {cells}/uL (ref 200–950)
Basophils Absolute: 21 {cells}/uL (ref 0–200)
Basophils Relative: 0.3 %
Eosinophils Absolute: 121 {cells}/uL (ref 15–500)
Eosinophils Relative: 1.7 %
HCT: 40.1 % (ref 35.0–45.0)
Hemoglobin: 13.2 g/dL (ref 11.7–15.5)
MCH: 30.1 pg (ref 27.0–33.0)
MCHC: 32.9 g/dL (ref 32.0–36.0)
MCV: 91.3 fL (ref 80.0–100.0)
MPV: 12.1 fL (ref 7.5–12.5)
Monocytes Relative: 7.6 %
Neutro Abs: 4558 {cells}/uL (ref 1500–7800)
Neutrophils Relative %: 64.2 %
Platelets: 263 10*3/uL (ref 140–400)
RBC: 4.39 10*6/uL (ref 3.80–5.10)
RDW: 12.9 % (ref 11.0–15.0)
Total Lymphocyte: 26.2 %
WBC: 7.1 10*3/uL (ref 3.8–10.8)

## 2023-12-28 LAB — LIPID PANEL
Cholesterol: 163 mg/dL (ref <200)
HDL: 54 mg/dL (ref >=50)
LDL Cholesterol (Calc): 88 mg/dL
Non-HDL Cholesterol (Calc): 109 mg/dL (ref <130)
Total CHOL/HDL Ratio: 3 (calc) (ref <5.0)
Triglycerides: 110 mg/dL (ref <150)

## 2023-12-28 LAB — HEMOGLOBIN A1C W/OUT EAG: Hgb A1c MFr Bld: 5.7 % — ABNORMAL HIGH (ref <5.7)

## 2024-02-13 ENCOUNTER — Other Ambulatory Visit: Payer: Self-pay | Admitting: Internal Medicine

## 2024-02-13 DIAGNOSIS — Z87898 Personal history of other specified conditions: Secondary | ICD-10-CM

## 2024-02-13 DIAGNOSIS — I10 Essential (primary) hypertension: Secondary | ICD-10-CM

## 2024-02-14 DIAGNOSIS — H5213 Myopia, bilateral: Secondary | ICD-10-CM | POA: Diagnosis not present

## 2024-04-25 ENCOUNTER — Other Ambulatory Visit: Payer: Self-pay | Admitting: Internal Medicine

## 2024-04-25 DIAGNOSIS — Z1231 Encounter for screening mammogram for malignant neoplasm of breast: Secondary | ICD-10-CM

## 2024-05-12 ENCOUNTER — Other Ambulatory Visit: Payer: Self-pay | Admitting: Internal Medicine

## 2024-05-29 ENCOUNTER — Ambulatory Visit
Admission: RE | Admit: 2024-05-29 | Discharge: 2024-05-29 | Disposition: A | Discharge status: Home / Self Care | Source: Ambulatory Visit | Attending: Internal Medicine | Admitting: Internal Medicine

## 2024-05-29 DIAGNOSIS — Z1231 Encounter for screening mammogram for malignant neoplasm of breast: Secondary | ICD-10-CM | POA: Diagnosis not present

## 2024-07-03 ENCOUNTER — Other Ambulatory Visit: Payer: Self-pay

## 2024-07-03 DIAGNOSIS — I10 Essential (primary) hypertension: Secondary | ICD-10-CM

## 2024-07-03 DIAGNOSIS — Z13220 Encounter for screening for lipoid disorders: Secondary | ICD-10-CM

## 2024-07-03 DIAGNOSIS — Z0000 Encounter for general adult medical examination without abnormal findings: Secondary | ICD-10-CM

## 2024-07-03 NOTE — Progress Notes (Signed)
 Annual Wellness Visit   Patient Care Team: Perri Ronal PARAS, MD as PCP - General (Internal Medicine)  Visit Date: 07/10/24   Chief Complaint  Patient presents with   Medicare Wellness VIsit   Annual Exam   Subjective:  Patient: Stephanie Hernandez, Female DOB: February 02, 1951, 73 y.o. MRN: [id number] Vitals:   07/10/24 1501  BP: 134/84   [name] is a 73 y.o. Female who presents today for her Annual Wellness Visit. Patient has Hypertension; Anxiety; Palpitations; Depression; Vitamin D  deficiency; and Insomnia on their problem list.  History of hypertension treated with losartan  25 mg daily. Blood pressure normal in-office today at 134/84.   History of palpitations treated with pindolol  5-10 mg daily.   History of anxiety and depression treated with alprazolam  0.5 mg twice daily as needed, venlafaxine  XR 75 mg daily.   Had Herpes zoster 2018 treated with Valtrex.   Past medical history: Fractured fifth metatarsal 2004.  Became menopausal around age 89.  Tetanus immunization April 2021.  No history of operations except for benign left breast biopsy December 2015.   Labs 07/03/2024 Blood glucose 100, Otherwise WNL.   05/29/2024 Mammogram No mammographic evidence of malignancy. Repeat in one year.    Had colonoscopy in [place] August 2018 with 10-year follow-up recommended.    Vaccine counseling: Influenza vaccine received today. Covid-19 vaccine due.   Health Maintenance  Topic Date Due   COVID-19 Vaccine (8 - 2025-26 season) 04/21/2024   Medicare Annual Wellness (AWV)  06/27/2024   Mammogram  05/29/2026   Colonoscopy  04/13/2027   DTaP/Tdap/Td (3 - Td or Tdap) 10/16/2031   Pneumococcal Vaccine: 50+ Years  Completed   Influenza Vaccine  Completed   Bone Density Scan  Completed   Hepatitis C Screening  Completed   Zoster Vaccines- Shingrix  Completed   Meningococcal B Vaccine  Aged Out     Review of Systems  Constitutional:  Negative for fever and malaise/fatigue.  HENT:   Negative for congestion.   Eyes:  Negative for blurred vision.  Respiratory:  Negative for cough and shortness of breath.   Cardiovascular:  Negative for chest pain, palpitations and leg swelling.  Gastrointestinal:  Negative for vomiting.  Musculoskeletal:  Negative for back pain.  Skin:  Negative for rash.  Neurological:  Negative for loss of consciousness and headaches.  All other systems reviewed and are negative.  Objective:  Vitals: body mass index is 35.67 kg/m. Today's Vitals   07/10/24 1501  BP: 134/84  Pulse: 79  SpO2: 98%  Weight: 195 lb (88.5 kg)  Height: 5' 2 (1.575 m)   Physical Exam Vitals and nursing note reviewed.  Constitutional:      General: She is not in acute distress.    Appearance: Normal appearance. She is not ill-appearing or toxic-appearing.  HENT:     Head: Normocephalic and atraumatic.     Right Ear: Hearing, tympanic membrane, ear canal and external ear normal.     Left Ear: Hearing, tympanic membrane, ear canal and external ear normal.     Mouth/Throat:     Pharynx: Oropharynx is clear.  Eyes:     Extraocular Movements: Extraocular movements intact.     Pupils: Pupils are equal, round, and reactive to light.  Neck:     Thyroid: No thyroid mass, thyromegaly or thyroid tenderness.     Vascular: No carotid bruit.  Cardiovascular:     Rate and Rhythm: Normal rate and regular rhythm. No extrasystoles are present.  Pulses:          Dorsalis pedis pulses are 2+ on the right side and 2+ on the left side.     Heart sounds: Normal heart sounds. No murmur heard.    No friction rub. No gallop.  Pulmonary:     Effort: Pulmonary effort is normal.     Breath sounds: Normal breath sounds. No decreased breath sounds, wheezing, rhonchi or rales.  Chest:     Chest wall: No mass.  Abdominal:     Palpations: Abdomen is soft. There is no hepatomegaly, splenomegaly or mass.     Tenderness: There is no abdominal tenderness.     Hernia: No hernia is  present.  Genitourinary:    Comments: GYN deferred.  Musculoskeletal:     Cervical back: Normal range of motion.     Right lower leg: No edema.     Left lower leg: No edema.  Lymphadenopathy:     Cervical: No cervical adenopathy.     Upper Body:     Right upper body: No supraclavicular adenopathy.     Left upper body: No supraclavicular adenopathy.  Skin:    General: Skin is warm and dry.  Neurological:     General: No focal deficit present.     Mental Status: She is alert and oriented to person, place, and time. Mental status is at baseline.     Sensory: Sensation is intact.     Motor: Motor function is intact. No weakness.     Deep Tendon Reflexes: Reflexes are normal and symmetric.  Psychiatric:        Attention and Perception: Attention normal.        Mood and Affect: Mood normal.        Speech: Speech normal.        Behavior: Behavior normal.        Thought Content: Thought content normal.        Cognition and Memory: Cognition normal.        Judgment: Judgment normal.     Current Outpatient Medications  Medication Instructions   ALPRAZolam  (XANAX ) 0.5 mg, Oral, 2 times daily PRN   losartan  (COZAAR ) 25 mg, Oral, Daily   pindolol  (VISKEN ) 5 MG tablet TAKE 1 TO 2 TABLETS DAILY FOR PALPITATIONS   venlafaxine  XR (EFFEXOR -XR) 37.5 mg, Oral, Daily with breakfast   VITAMIN D , CHOLECALCIFEROL, PO 2,000 Units, Daily at bedtime   Past Medical History:  Diagnosis Date   Hypertension    Medical/Surgical History Narrative:  Allergic/Intolerant to:  Allergies  Allergen Reactions   Sulfa Antibiotics Rash    Family History  Problem Relation Age of Onset   Heart disease Mother    Heart disease Father    Breast cancer Neg Hx     Most Recent Health Risks Assessment:   Most Recent Social Determinants of Health (Including Hx of Tobacco, Alcohol, and Drug Use) SDOH Screenings   Food Insecurity: Unknown (07/06/2024)  Housing: Unknown (07/06/2024)  Transportation Needs: No  Transportation Needs (07/06/2024)  Utilities: Not At Risk (06/28/2023)  Alcohol Screen: Low Risk  (06/28/2023)  Depression (PHQ2-9): Low Risk  (07/10/2024)  Financial Resource Strain: Low Risk  (07/06/2024)  Physical Activity: Insufficiently Active (07/06/2024)  Social Connections: Moderately Integrated (07/06/2024)  Stress: No Stress Concern Present (07/06/2024)  Tobacco Use: Low Risk  (07/10/2024)  Health Literacy: Adequate Health Literacy (06/28/2023)   Social History   Tobacco Use   Smoking status: Never   Smokeless tobacco: Never  Substance Use  Topics   Alcohol use: No   Drug use: No   Most Recent Functional Status Assessment:    07/06/2024    2:50 PM  In your present state of health, do you have any difficulty performing the following activities:  Hearing? 0  Vision? 0  Difficulty concentrating or making decisions? 0  Walking or climbing stairs? 0  Dressing or bathing? 0  Doing errands, shopping? 0  Preparing Food and eating ? N  Using the Toilet? N  In the past six months, have you accidently leaked urine? N  Do you have problems with loss of bowel control? N  Managing your Medications? N  Managing your Finances? N  Housekeeping or managing your Housekeeping? N   Most Recent Fall Risk Assessment:    07/10/2024    2:58 PM  Fall Risk   Falls in the past year? 0  Number falls in past yr: 0  Injury with Fall? 0  Risk for fall due to : No Fall Risks  Follow up Falls evaluation completed   Most Recent Anxiety/Depression Screenings:    07/10/2024    3:06 PM 12/27/2023    9:24 AM  PHQ 2/9 Scores  PHQ - 2 Score 0 0      07/10/2024    3:06 PM  GAD 7 : Generalized Anxiety Score  Nervous, Anxious, on Edge 0  Control/stop worrying 0  Worry too much - different things 0  Trouble relaxing 0  Restless 0  Easily annoyed or irritable 0  Afraid - awful might happen 0  Total GAD 7 Score 0  Anxiety Difficulty Not difficult at all   Most Recent Cognitive  Screening:    06/28/2023    3:09 PM  6CIT Screen  What Year? 0 points  What month? 0 points  What time? 0 points  Count back from 20 0 points  Months in reverse 0 points  Repeat phrase 0 points  Total Score 0 points   Most Recent Vision/Hearing Screenings: Hearing Screening   500Hz  1000Hz  2000Hz  5000Hz   Right ear Pass Pass Pass Pass  Left ear Pass Pass Pass Pass   Vision Screening   Right eye Left eye Both eyes  Without correction 20/30 20/30 20/20   With correction      Results:  Studies Obtained And Personally Reviewed By Me:  05/29/2024 Mammogram No mammographic evidence of malignancy. Repeat in one year.    Had colonoscopy in [place] August 2018 with 10-year follow-up recommended.    Labs:  CBC w/ Differential Lab Results  Component Value Date   WBC 8.4 07/03/2024   RBC 4.38 07/03/2024   HGB 13.3 07/03/2024   HCT 41.2 07/03/2024   PLT 309 07/03/2024   MCV 94.1 07/03/2024   MCH 30.4 07/03/2024   MCHC 32.3 07/03/2024   RDW 12.6 07/03/2024   MPV 12.0 07/03/2024   LYMPHSABS 1,484 06/19/2022   MONOABS 385 05/12/2016   BASOSABS 0 07/03/2024    Comprehensive Metabolic Panel Lab Results  Component Value Date   NA 139 07/03/2024   K 4.9 07/03/2024   CL 102 07/03/2024   CO2 28 07/03/2024   GLUCOSE 100 (H) 07/03/2024   BUN 18 07/03/2024   CREATININE 0.78 07/03/2024   CALCIUM 10.1 07/03/2024   PROT 6.5 07/03/2024   ALBUMIN 4.3 05/12/2016   AST 27 07/03/2024   ALT 29 07/03/2024   ALKPHOS 84 05/12/2016   BILITOT 0.8 07/03/2024   EGFR 81 07/03/2024   GFRNONAA 54 (L) 06/15/2020  Lipid Panel  Lab Results  Component Value Date   CHOL 166 07/03/2024   HDL 52 07/03/2024   LDLCALC 93 07/03/2024   TRIG 117 07/03/2024   A1c Lab Results  Component Value Date   HGBA1C 5.5 07/03/2024    TSH Lab Results  Component Value Date   TSH 1.78 07/03/2024    Assessment & Plan:   Orders Placed This Encounter  Procedures   Flu vaccine HIGH DOSE PF(Fluzone  Trivalent)   POCT URINALYSIS DIP (CLINITEK)   Meds ordered this encounter  Medications   ALPRAZolam  (XANAX ) 0.5 MG tablet    Sig: Take 1 tablet (0.5 mg total) by mouth 2 (two) times daily as needed for anxiety.    Dispense:  20 tablet    Refill:  0  Urinary frequency. Urine specimen is WNL.  Asking about GYN exam. Mother had hx of GYN malignancy. Patient will contact GYN physician with  [facility] in [place] to have GYN exam.  Hypertension: treated with losartan  25 mg daily. Blood pressure normal in-office today at 134/84.   Palpitations: treated with pindolol  5-10 mg daily.   Anxiety and depression: treated with alprazolam  0.5 mg twice daily as needed, venlafaxine  XR 75 mg daily. Alprazolam  refilled.  Continue Venlafaxine .  C/O urinary frequency- urine specimen shows no evidence of infection.  05/29/2024 Mammogram No mammographic evidence of malignancy. Repeat in one year.    Hx of elevated calcium level of 10.5 in 2024. Has been repeated 3 times since with levels ranging from 10.0 to 10.2. patient reassured.  Had colonoscopy in [facility] August 2018 with 10-year follow-up recommended.    Vaccine counseling: Influenza vaccine received today. Covid-19 vaccine due.    Annual Wellness Visit done today including the all of the following: Reviewed patient's Family Medical History Reviewed patient's SDOH and reviewed tobacco, alcohol, and drug use.  Reviewed and updated list of patient's medical providers Assessment of cognitive impairment was done Assessed patient's functional ability Established a written schedule for health screening services Health Risk Assessent Completed and Reviewed  Discussed health benefits of physical activity, and encouraged her to engage in regular exercise appropriate for her age and condition.    I,Makayla C Reid,acting as a scribe for Ronal JINNY Hailstone, MD.,have documented all relevant documentation on the behalf of Ronal JINNY Hailstone, MD,as directed  by  Ronal JINNY Hailstone, MD while in the presence of Ronal JINNY Hailstone, MD.  I, Ronal JINNY Hailstone, MD, have reviewed all documentation for and agree with the above Annual Wellness Visit documentation.  Ronal JINNY Hailstone, MD Internal Medicine 07/10/2024

## 2024-07-04 LAB — CBC WITH DIFFERENTIAL/PLATELET
Absolute Lymphocytes: 1865 {cells}/uL (ref 850–3900)
Absolute Monocytes: 479 {cells}/uL (ref 200–950)
Basophils Absolute: 0 {cells}/uL (ref 0–200)
Basophils Relative: 0 %
Eosinophils Absolute: 101 {cells}/uL (ref 15–500)
Eosinophils Relative: 1.2 %
HCT: 41.2 % (ref 35.0–45.0)
Hemoglobin: 13.3 g/dL (ref 11.7–15.5)
MCH: 30.4 pg (ref 27.0–33.0)
MCHC: 32.3 g/dL (ref 32.0–36.0)
MCV: 94.1 fL (ref 80.0–100.0)
MPV: 12 fL (ref 7.5–12.5)
Monocytes Relative: 5.7 %
Neutro Abs: 5956 {cells}/uL (ref 1500–7800)
Neutrophils Relative %: 70.9 %
Platelets: 309 Thousand/uL (ref 140–400)
RBC: 4.38 Million/uL (ref 3.80–5.10)
RDW: 12.6 % (ref 11.0–15.0)
Total Lymphocyte: 22.2 %
WBC: 8.4 Thousand/uL (ref 3.8–10.8)

## 2024-07-04 LAB — COMPREHENSIVE METABOLIC PANEL WITH GFR
AG Ratio: 2.1 (calc) (ref 1.0–2.5)
ALT: 29 U/L (ref 6–29)
AST: 27 U/L (ref 10–35)
Albumin: 4.4 g/dL (ref 3.6–5.1)
Alkaline phosphatase (APISO): 83 U/L (ref 37–153)
BUN: 18 mg/dL (ref 7–25)
CO2: 28 mmol/L (ref 20–32)
Calcium: 10.1 mg/dL (ref 8.6–10.4)
Chloride: 102 mmol/L (ref 98–110)
Creat: 0.78 mg/dL (ref 0.60–1.00)
Globulin: 2.1 g/dL (ref 1.9–3.7)
Glucose, Bld: 100 mg/dL — ABNORMAL HIGH (ref 65–99)
Potassium: 4.9 mmol/L (ref 3.5–5.3)
Sodium: 139 mmol/L (ref 135–146)
Total Bilirubin: 0.8 mg/dL (ref 0.2–1.2)
Total Protein: 6.5 g/dL (ref 6.1–8.1)
eGFR: 81 mL/min/1.73m2 (ref >=60)

## 2024-07-04 LAB — LIPID PANEL
Cholesterol: 166 mg/dL (ref <200)
HDL: 52 mg/dL (ref >=50)
LDL Cholesterol (Calc): 93 mg/dL
Non-HDL Cholesterol (Calc): 114 mg/dL (ref <130)
Total CHOL/HDL Ratio: 3.2 (calc) (ref <5.0)
Triglycerides: 117 mg/dL (ref <150)

## 2024-07-04 LAB — HEMOGLOBIN A1C
Hgb A1c MFr Bld: 5.5 % (ref <5.7)
Mean Plasma Glucose: 111 mg/dL
eAG (mmol/L): 6.2 mmol/L

## 2024-07-04 LAB — TSH: TSH: 1.78 m[IU]/L (ref 0.40–4.50)

## 2024-07-10 ENCOUNTER — Ambulatory Visit (INDEPENDENT_AMBULATORY_CARE_PROVIDER_SITE_OTHER): Payer: Self-pay | Admitting: Internal Medicine

## 2024-07-10 ENCOUNTER — Encounter: Payer: Self-pay | Admitting: Internal Medicine

## 2024-07-10 VITALS — BP 134/84 | HR 79 | Temp 98.1°F | Ht 62.0 in | Wt 195.0 lb

## 2024-07-10 DIAGNOSIS — Z23 Encounter for immunization: Secondary | ICD-10-CM

## 2024-07-10 DIAGNOSIS — Z87898 Personal history of other specified conditions: Secondary | ICD-10-CM

## 2024-07-10 DIAGNOSIS — F32A Depression, unspecified: Secondary | ICD-10-CM

## 2024-07-10 DIAGNOSIS — Z0000 Encounter for general adult medical examination without abnormal findings: Secondary | ICD-10-CM

## 2024-07-10 DIAGNOSIS — F419 Anxiety disorder, unspecified: Secondary | ICD-10-CM | POA: Diagnosis not present

## 2024-07-10 DIAGNOSIS — R350 Frequency of micturition: Secondary | ICD-10-CM | POA: Diagnosis not present

## 2024-07-10 DIAGNOSIS — I10 Essential (primary) hypertension: Secondary | ICD-10-CM | POA: Diagnosis not present

## 2024-07-10 DIAGNOSIS — Z8659 Personal history of other mental and behavioral disorders: Secondary | ICD-10-CM

## 2024-07-10 LAB — POCT URINALYSIS DIP (CLINITEK)
Bilirubin, UA: NEGATIVE
Glucose, UA: NEGATIVE mg/dL
Ketones, POC UA: NEGATIVE mg/dL
Leukocytes, UA: NEGATIVE
Nitrite, UA: NEGATIVE
POC PROTEIN,UA: NEGATIVE
Spec Grav, UA: <=1.005 — AB (ref 1.010–1.025)
Urobilinogen, UA: 0.2 U/dL
pH, UA: 7.5 (ref 5.0–8.0)

## 2024-07-10 MED ORDER — ALPRAZOLAM 0.5 MG PO TABS: 0.5000 mg | ORAL_TABLET | Freq: Two times a day (BID) | ORAL | 0 refills | Status: AC | PRN

## 2024-07-11 ENCOUNTER — Encounter: Payer: Self-pay | Admitting: Internal Medicine

## 2024-07-11 NOTE — Patient Instructions (Addendum)
 It was a pleasure to see you today. Labs are stable. Flu vaccine given. Return in 6 months. No change in medications. Will see GYN in [facility].

## 2024-07-18 ENCOUNTER — Encounter: Payer: Self-pay | Admitting: Internal Medicine

## 2024-07-24 DIAGNOSIS — M79672 Pain in left foot: Secondary | ICD-10-CM | POA: Diagnosis not present

## 2024-08-07 DIAGNOSIS — M79672 Pain in left foot: Secondary | ICD-10-CM | POA: Diagnosis not present

## 2025-01-08 ENCOUNTER — Other Ambulatory Visit

## 2025-01-15 ENCOUNTER — Ambulatory Visit: Admitting: Internal Medicine
